# Patient Record
Sex: Female | Born: 1945
Health system: Southern US, Community
[De-identification: ages and names within clinical notes are randomized; demographics above are authoritative.]

## PROBLEM LIST (undated history)

## (undated) DIAGNOSIS — J301 Allergic rhinitis due to pollen: Secondary | ICD-10-CM

## (undated) DIAGNOSIS — K219 Gastro-esophageal reflux disease without esophagitis: Secondary | ICD-10-CM

## (undated) DIAGNOSIS — E785 Hyperlipidemia, unspecified: Secondary | ICD-10-CM

## (undated) DIAGNOSIS — M5431 Sciatica, right side: Secondary | ICD-10-CM

## (undated) DIAGNOSIS — K645 Perianal venous thrombosis: Secondary | ICD-10-CM

## (undated) DIAGNOSIS — K589 Irritable bowel syndrome without diarrhea: Secondary | ICD-10-CM

## (undated) DIAGNOSIS — G4700 Insomnia, unspecified: Secondary | ICD-10-CM

## (undated) DIAGNOSIS — M5136 Other intervertebral disc degeneration, lumbar region: Secondary | ICD-10-CM

## (undated) DIAGNOSIS — R296 Repeated falls: Secondary | ICD-10-CM

## (undated) DIAGNOSIS — E559 Vitamin D deficiency, unspecified: Secondary | ICD-10-CM

## (undated) DIAGNOSIS — M8580 Other specified disorders of bone density and structure, unspecified site: Secondary | ICD-10-CM

## (undated) DIAGNOSIS — J329 Chronic sinusitis, unspecified: Secondary | ICD-10-CM

## (undated) DIAGNOSIS — I253 Aneurysm of heart: Secondary | ICD-10-CM

## (undated) DIAGNOSIS — E2839 Other primary ovarian failure: Secondary | ICD-10-CM

## (undated) DIAGNOSIS — M722 Plantar fascial fibromatosis: Secondary | ICD-10-CM

## (undated) DIAGNOSIS — N8110 Cystocele, unspecified: Secondary | ICD-10-CM

## (undated) DIAGNOSIS — K5900 Constipation, unspecified: Secondary | ICD-10-CM

## (undated) DIAGNOSIS — M51369 Other intervertebral disc degeneration, lumbar region without mention of lumbar back pain or lower extremity pain: Secondary | ICD-10-CM

## (undated) HISTORY — DX: Plantar fascial fibromatosis: M72.2

## (undated) HISTORY — DX: Other specified disorders of bone density and structure, unspecified site: M85.80

## (undated) HISTORY — DX: Perianal venous thrombosis: K64.5

## (undated) HISTORY — DX: Gastro-esophageal reflux disease without esophagitis: K21.9

## (undated) HISTORY — DX: Sciatica, right side: M54.31

## (undated) HISTORY — DX: Irritable bowel syndrome, unspecified: K58.9

## (undated) HISTORY — PX: TUBAL LIGATION: SHX77

## (undated) HISTORY — DX: Hyperlipidemia, unspecified: E78.5

## (undated) HISTORY — DX: Cystocele, unspecified: N81.10

## (undated) HISTORY — DX: Allergic rhinitis due to pollen: J30.1

## (undated) HISTORY — DX: Other intervertebral disc degeneration, lumbar region without mention of lumbar back pain or lower extremity pain: M51.369

## (undated) HISTORY — DX: Aneurysm of heart: I25.3

## (undated) HISTORY — DX: Constipation, unspecified: K59.00

## (undated) HISTORY — DX: Other intervertebral disc degeneration, lumbar region: M51.36

## (undated) HISTORY — DX: Chronic sinusitis, unspecified: J32.9

## (undated) HISTORY — DX: Repeated falls: R29.6

## (undated) HISTORY — DX: Insomnia, unspecified: G47.00

## (undated) HISTORY — DX: Other primary ovarian failure: E28.39

## (undated) HISTORY — DX: Vitamin D deficiency, unspecified: E55.9

---
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
## 1992-08-27 HISTORY — PX: OTHER SURGICAL HISTORY: SHX169

## 1998-04-11 ENCOUNTER — Ambulatory Visit (HOSPITAL_COMMUNITY): Admission: RE | Admit: 1998-04-11 | Discharge: 1998-04-11 | Payer: Self-pay | Admitting: Internal Medicine

## 1999-05-12 ENCOUNTER — Other Ambulatory Visit: Admission: RE | Admit: 1999-05-12 | Discharge: 1999-05-12 | Payer: Self-pay | Admitting: Internal Medicine

## 2000-08-28 ENCOUNTER — Other Ambulatory Visit: Admission: RE | Admit: 2000-08-28 | Discharge: 2000-08-28 | Payer: Self-pay | Admitting: Internal Medicine

## 2001-10-15 ENCOUNTER — Other Ambulatory Visit: Admission: RE | Admit: 2001-10-15 | Discharge: 2001-10-15 | Payer: Self-pay | Admitting: Internal Medicine

## 2002-10-21 ENCOUNTER — Other Ambulatory Visit: Admission: RE | Admit: 2002-10-21 | Discharge: 2002-10-21 | Payer: Self-pay | Admitting: Internal Medicine

## 2002-12-01 ENCOUNTER — Encounter: Payer: Self-pay | Admitting: Internal Medicine

## 2002-12-01 ENCOUNTER — Ambulatory Visit (HOSPITAL_COMMUNITY): Admission: RE | Admit: 2002-12-01 | Discharge: 2002-12-01 | Payer: Self-pay | Admitting: Internal Medicine

## 2005-01-23 ENCOUNTER — Other Ambulatory Visit: Admission: RE | Admit: 2005-01-23 | Discharge: 2005-01-23 | Payer: Self-pay | Admitting: Family Medicine

## 2005-06-14 ENCOUNTER — Ambulatory Visit: Payer: Self-pay | Admitting: Internal Medicine

## 2005-06-19 ENCOUNTER — Ambulatory Visit: Payer: Self-pay | Admitting: Internal Medicine

## 2005-11-21 ENCOUNTER — Ambulatory Visit: Payer: Self-pay | Admitting: Internal Medicine

## 2005-11-27 ENCOUNTER — Ambulatory Visit: Payer: Self-pay | Admitting: Internal Medicine

## 2006-03-13 ENCOUNTER — Other Ambulatory Visit: Admission: RE | Admit: 2006-03-13 | Discharge: 2006-03-13 | Payer: Self-pay | Admitting: Family Medicine

## 2007-03-18 ENCOUNTER — Other Ambulatory Visit: Admission: RE | Admit: 2007-03-18 | Discharge: 2007-03-18 | Payer: Self-pay | Admitting: Family Medicine

## 2008-03-18 ENCOUNTER — Other Ambulatory Visit: Admission: RE | Admit: 2008-03-18 | Discharge: 2008-03-18 | Payer: Self-pay | Admitting: Family Medicine

## 2008-11-23 ENCOUNTER — Encounter: Admission: RE | Admit: 2008-11-23 | Discharge: 2008-11-23 | Payer: Self-pay | Admitting: Family Medicine

## 2009-04-25 ENCOUNTER — Other Ambulatory Visit: Admission: RE | Admit: 2009-04-25 | Discharge: 2009-04-25 | Payer: Self-pay | Admitting: Family Medicine

## 2009-08-27 HISTORY — PX: OTHER SURGICAL HISTORY: SHX169

## 2011-05-07 ENCOUNTER — Other Ambulatory Visit (HOSPITAL_COMMUNITY)
Admission: RE | Admit: 2011-05-07 | Discharge: 2011-05-07 | Disposition: A | Discharge status: Home / Self Care | Payer: BC Managed Care – PPO | Source: Ambulatory Visit | Attending: Family Medicine | Admitting: Family Medicine

## 2011-05-07 ENCOUNTER — Other Ambulatory Visit: Payer: Self-pay | Admitting: Family Medicine

## 2011-05-07 DIAGNOSIS — Z0000 Encounter for general adult medical examination without abnormal findings: Secondary | ICD-10-CM | POA: Insufficient documentation

## 2013-05-22 ENCOUNTER — Telehealth: Payer: Self-pay | Admitting: Internal Medicine

## 2013-05-22 NOTE — Telephone Encounter (Signed)
Pt scheduled to see Willette Cluster NP 05/28/13@10am . Referring office to notify pt of appt and fax records.

## 2013-05-28 ENCOUNTER — Ambulatory Visit: Payer: BC Managed Care – PPO | Admitting: Nurse Practitioner

## 2013-06-15 ENCOUNTER — Ambulatory Visit (INDEPENDENT_AMBULATORY_CARE_PROVIDER_SITE_OTHER)
Admission: RE | Admit: 2013-06-15 | Discharge: 2013-06-15 | Disposition: A | Discharge status: Home / Self Care | Payer: Medicare Other | Source: Ambulatory Visit | Attending: Gastroenterology | Admitting: Gastroenterology

## 2013-06-15 ENCOUNTER — Ambulatory Visit (INDEPENDENT_AMBULATORY_CARE_PROVIDER_SITE_OTHER): Payer: Medicare Other | Admitting: Gastroenterology

## 2013-06-15 ENCOUNTER — Encounter: Payer: Self-pay | Admitting: Gastroenterology

## 2013-06-15 VITALS — BP 116/80 | HR 68 | Ht 67.0 in | Wt 158.2 lb

## 2013-06-15 DIAGNOSIS — R221 Localized swelling, mass and lump, neck: Secondary | ICD-10-CM

## 2013-06-15 DIAGNOSIS — R1310 Dysphagia, unspecified: Secondary | ICD-10-CM

## 2013-06-15 DIAGNOSIS — R220 Localized swelling, mass and lump, head: Secondary | ICD-10-CM

## 2013-06-15 NOTE — Progress Notes (Signed)
I agree with going for esophagram first.

## 2013-06-15 NOTE — Progress Notes (Signed)
06/15/2013 Tracie Young [id number] 1945-11-20   HISTORY OF PRESENT ILLNESS:  This is a pleasant 67 year old female who is a patient of Dr. Regino Schultze.  She presents to our office today with complaints of intermittent dysphagia, mostly to pills.  She does not notice that food gets stuck, but her pills will get hung up at times.  Some days it is worse than others.  Points to the sternal notch when she describes her symptoms.  She does have reflux and takes Nexium daily for quite some time.  She had an EGD in 12/2002 at which time the study was normal.  She feels that her reflux is well controlled with the Nexium.  She does not choke on food or pills and they do not come back up.  No weight loss or change in appetite.     Past Medical History  Diagnosis Date  . Hyperlipidemia   . GERD (gastroesophageal reflux disease)    Past Surgical History  Procedure Laterality Date  . Tonsillectomy and adenoidectomy  1958  . Tubal ligation      reports that she has never smoked. She has never used smokeless tobacco. She reports that she does not drink alcohol or use illicit drugs. family history includes Dementia in her mother; Heart disease in her father and sister; Lung cancer in her sister. Allergies  Allergen Reactions  . Codeine       Outpatient Encounter Prescriptions as of 06/15/2013  Medication Sig Dispense Refill  . atorvastatin (LIPITOR) 20 MG tablet       . cetirizine (ZYRTEC) 10 MG tablet Take 10 mg by mouth daily.      Marland Kitchen guaiFENesin (MUCINEX) 600 MG 12 hr tablet Take 1,200 mg by mouth as needed for congestion.      Marland Kitchen NEXIUM 40 MG capsule        No facility-administered encounter medications on file as of 06/15/2013.     REVIEW OF SYSTEMS  : All other systems reviewed and negative except where noted in the History of Present Illness.   PHYSICAL EXAM: BP 116/80  Pulse 68  Ht 5\' 7"  (1.702 m)  Wt 158 lb 3.2 oz (71.759 kg)  BMI 24.77 kg/m2 General: Well developed white female in  no acute distress Head: Normocephalic and atraumatic Eyes:  sclerae anicteric,conjunctive pink. Ears: Normal auditory acuity Neck: Supple.  She does have a palpable non-tender lump on the left side of the neck. Lungs: Clear throughout to auscultation Heart: Regular rate and rhythm Abdomen: Soft, non-tender, non-distended. No masses or hepatomegaly noted. Normal bowel sounds. Musculoskeletal: Symmetrical with no gross deformities  Skin: No lesions on visible extremities Extremities: No edema  Neurological: Alert oriented x 4, grossly nonfocal Psychological:  Alert and cooperative. Normal mood and affect  ASSESSMENT AND PLAN: -Dysphagia:  Mostly to pills only; intermittent.  ? Stricture or narrowing vs spasm/dysmotility vs Zenker's diverticulum.  I did speak to her about EGD vs barium esophagram with tablet.  She would like to proceed with esophagram for now.   -Left side neck mass/lump:  Will check 2 view neck/soft tissue X-rays.  May need CT scan for evaluation.  ? If this is related to her dysphagia issue.

## 2013-06-15 NOTE — Patient Instructions (Addendum)
Please go to our basement level Radiology department. We scheduled the Barium Tablet test for Wednesday 10-22 at [facility], 1st floor. Go to registration inside the front door of the hospital. Arrive at 9:15 am.   We will call you with the results.

## 2013-06-17 ENCOUNTER — Ambulatory Visit (HOSPITAL_COMMUNITY)
Admission: RE | Admit: 2013-06-17 | Discharge: 2013-06-17 | Disposition: A | Discharge status: Home / Self Care | Payer: Medicare Other | Source: Ambulatory Visit | Attending: Gastroenterology | Admitting: Gastroenterology

## 2013-06-17 DIAGNOSIS — R1310 Dysphagia, unspecified: Secondary | ICD-10-CM | POA: Insufficient documentation

## 2013-06-29 ENCOUNTER — Encounter: Payer: Self-pay | Admitting: Internal Medicine

## 2013-07-16 ENCOUNTER — Encounter: Payer: Self-pay | Admitting: *Deleted

## 2013-07-16 ENCOUNTER — Encounter: Payer: Self-pay | Admitting: Interventional Cardiology

## 2013-07-16 DIAGNOSIS — M722 Plantar fascial fibromatosis: Secondary | ICD-10-CM | POA: Insufficient documentation

## 2013-07-16 DIAGNOSIS — E559 Vitamin D deficiency, unspecified: Secondary | ICD-10-CM | POA: Insufficient documentation

## 2013-07-16 DIAGNOSIS — E785 Hyperlipidemia, unspecified: Secondary | ICD-10-CM | POA: Insufficient documentation

## 2013-07-16 DIAGNOSIS — K645 Perianal venous thrombosis: Secondary | ICD-10-CM | POA: Insufficient documentation

## 2013-07-16 DIAGNOSIS — K219 Gastro-esophageal reflux disease without esophagitis: Secondary | ICD-10-CM | POA: Insufficient documentation

## 2013-07-17 ENCOUNTER — Ambulatory Visit (INDEPENDENT_AMBULATORY_CARE_PROVIDER_SITE_OTHER): Payer: Medicare Other | Admitting: Interventional Cardiology

## 2013-07-17 ENCOUNTER — Encounter (INDEPENDENT_AMBULATORY_CARE_PROVIDER_SITE_OTHER): Payer: Self-pay

## 2013-07-17 ENCOUNTER — Encounter: Payer: Self-pay | Admitting: Interventional Cardiology

## 2013-07-17 VITALS — BP 130/80 | HR 65 | Ht 67.0 in | Wt 156.0 lb

## 2013-07-17 DIAGNOSIS — Z0389 Encounter for observation for other suspected diseases and conditions ruled out: Secondary | ICD-10-CM

## 2013-07-17 DIAGNOSIS — Z8249 Family history of ischemic heart disease and other diseases of the circulatory system: Secondary | ICD-10-CM | POA: Insufficient documentation

## 2013-07-17 DIAGNOSIS — E785 Hyperlipidemia, unspecified: Secondary | ICD-10-CM

## 2013-07-17 DIAGNOSIS — R079 Chest pain, unspecified: Secondary | ICD-10-CM

## 2013-07-17 MED ORDER — ASPIRIN EC 81 MG PO TBEC: 81.0000 mg | DELAYED_RELEASE_TABLET | Freq: Every day | ORAL | Status: AC

## 2013-07-17 NOTE — Patient Instructions (Signed)
Your physician recommends that you schedule a follow-up appointment as needed.  Your physician has recommended you make the following change in your medication:   1. Start Aspirin 81 mg 1 tablet daily.

## 2013-07-17 NOTE — Progress Notes (Signed)
Patient ID: Tracie Young, female   DOB: 05/29/1946, 67 y.o.   MRN: [id number]    [address] [place], [place]  [address] Phone: [phone] Fax:  [phone]  Date:  07/17/2013   ID:  Tracie Young, DOB 08/19/1946, MRN [id number]  PCP:  Cala Bradford, MD      History of Present Illness: Tracie Young is a 67 y.o. female who has a strong family h/o CAD. She had occasional chest discomfort in 2013. It felt like a pressure in her chest and can radiate in the arms. It can radiate to the neck. She can't identify any triggers. Some episodes come at rest. She has high cholesterol. She walks regularly and does yardwork, and has not had sx with these activities. Sx usually come on while watching TV or reading. Typically happens more in the afternoon or evening. Woken from sleep a few times. Total of 6-7 episodes. Longest episode was 60 minutes, but was not severe. Most severe episodes were short, 5 minutes.  She had a stress test in 11/13 showing no ischemia.  She did have some exertional sx with walking.    Sx are now better.  She remains active without any exertional sx.  She had some pressre after stopping nexium.  THis is better than last year.     Wt Readings from Last 3 Encounters:  07/17/13 156 lb (70.761 kg)  06/15/13 158 lb 3.2 oz (71.759 kg)     Past Medical History  Diagnosis Date  . Hyperlipidemia   . GERD (gastroesophageal reflux disease)   . External thrombosed hemorrhoids   . Vitamin D deficiency   . Dyslipidemia   . Plantar fasciitis     Current Outpatient Prescriptions  Medication Sig Dispense Refill  . atorvastatin (LIPITOR) 20 MG tablet       . cetirizine (ZYRTEC) 10 MG tablet Take 10 mg by mouth daily.      Marland Kitchen guaiFENesin (MUCINEX) 600 MG 12 hr tablet Take 1,200 mg by mouth as needed for congestion.      Marland Kitchen NEXIUM 40 MG capsule        No current facility-administered medications for this visit.    Allergies:    Allergies  Allergen Reactions    . Codeine     Social History:  The patient  reports that she has never smoked. She has never used smokeless tobacco. She reports that she does not drink alcohol or use illicit drugs.   Family History:  The patient's family history includes Dementia in her mother; Heart disease in her father and sister; Lung cancer in her sister.   ROS:  Please see the history of present illness.  No nausea, vomiting.  No fevers, chills.  No focal weakness.  No dysuria.   All other systems reviewed and negative.   PHYSICAL EXAM: VS:  BP 130/80  Pulse 65  Ht 5\' 7"  (1.702 m)  Wt 156 lb (70.761 kg)  BMI 24.43 kg/m2 Well nourished, well developed, in no acute distress HEENT: normal Neck: no JVD, no carotid bruits Cardiac:  normal S1, S2; RRR;  Lungs:  clear to auscultation bilaterally, no wheezing, rhonchi or rales Abd: soft, nontender, no hepatomegaly Ext: no edema Skin: warm and dry Neuro:   no focal abnormalities noted  EKG:  normal    ASSESSMENT AND PLAN:  1.Chest discomfort   Some atypical features.  ETT was ok in 2013.  Discussed additional testing but she would like to  hold off.   2. Family history of early CAD  Sister with PTCA, she was a smoker. OK to take aspirin 81 mg daily.  We discussed that aspirin use is currently somewhat controversial.  She has not had bleeding problems.  She does have an atrial septal aneurysm.  She will take aspirin and let us know if she has some bleeding problems.  3. Hypercholesteremia  Continue Lipitor Tablet, 20 MG, 1 tablet, by mouth, Once a day Well controlled in 9/13. LDL 98 in 9/14, HDL 57.     Signed, Fredric Mare, MD, [facility] 07/17/2013 10:25 AM

## 2014-05-20 ENCOUNTER — Telehealth: Payer: Self-pay | Admitting: Neurology

## 2014-05-20 ENCOUNTER — Ambulatory Visit: Payer: Medicare Other | Admitting: Neurology

## 2014-05-20 NOTE — Telephone Encounter (Signed)
Pt cancelled NP appt w/ Dr. Tomi Likens. Referring office notified via EPIC referral / Sherri S.

## 2014-06-21 ENCOUNTER — Other Ambulatory Visit: Payer: Self-pay | Admitting: Family Medicine

## 2014-06-21 ENCOUNTER — Other Ambulatory Visit (HOSPITAL_COMMUNITY)
Admission: RE | Admit: 2014-06-21 | Discharge: 2014-06-21 | Disposition: A | Discharge status: Home / Self Care | Payer: Medicare PPO | Source: Ambulatory Visit | Attending: Family Medicine | Admitting: Family Medicine

## 2014-06-21 DIAGNOSIS — Z124 Encounter for screening for malignant neoplasm of cervix: Secondary | ICD-10-CM | POA: Insufficient documentation

## 2014-06-22 LAB — CYTOLOGY - PAP

## 2015-09-05 ENCOUNTER — Ambulatory Visit: Payer: Medicare Other | Admitting: Gastroenterology

## 2015-10-18 DIAGNOSIS — E785 Hyperlipidemia, unspecified: Secondary | ICD-10-CM | POA: Diagnosis not present

## 2015-11-01 ENCOUNTER — Ambulatory Visit (INDEPENDENT_AMBULATORY_CARE_PROVIDER_SITE_OTHER): Payer: PPO | Admitting: Gastroenterology

## 2015-11-01 ENCOUNTER — Encounter: Payer: Self-pay | Admitting: Gastroenterology

## 2015-11-01 ENCOUNTER — Ambulatory Visit: Payer: Medicare Other | Admitting: Gastroenterology

## 2015-11-01 VITALS — BP 102/66 | HR 84 | Ht 67.0 in | Wt 161.4 lb

## 2015-11-01 DIAGNOSIS — K5902 Outlet dysfunction constipation: Secondary | ICD-10-CM

## 2015-11-01 DIAGNOSIS — K219 Gastro-esophageal reflux disease without esophagitis: Secondary | ICD-10-CM

## 2015-11-01 MED ORDER — NA SULFATE-K SULFATE-MG SULF 17.5-3.13-1.6 GM/177ML PO SOLN: 1.0000 | Freq: Once | ORAL | Status: DC

## 2015-11-01 NOTE — Addendum Note (Signed)
Addended by: Oda Kilts on: 11/01/2015 03:56 PM   Modules accepted: Orders

## 2015-11-01 NOTE — Progress Notes (Signed)
ASTELLA COFFELT    [id number]    20-Sep-1945  Primary Care Physician:WHITE,CYNTHIA S, MD  Referring Physician: Harlan Stains, MD [place], [place] [address]  Chief complaint: Constipation  HPI: 70 year old female with history of chronic GERD and constipation previously followed by Dr. Olevia Perches is here to establish care. She has difficulty evacuating, has tried multiple laxatives with no improvement, feels she gets bloated with abdominal discomfort when she takes laxatives that fails to have a bowel movement or has to take large amount until she has diarrhea or liquid stool to evacuate She has been using rectal enema once every 3 days for past 10 years to have a bowel movement or evacuate. She denies any blood per rectum. Last colonoscopy in 2007 was normal. Denies any pelvic surgeries. She is on Nexium daily with stable GERD symptoms, denies dysphagia, odynophagia, nausea or vomiting.   Outpatient Encounter Prescriptions as of 11/01/2015  Medication Sig  . aspirin EC 81 MG tablet Take 1 tablet (81 mg total) by mouth daily.  Marland Kitchen atorvastatin (LIPITOR) 20 MG tablet Take 10 mg by mouth daily at 6 PM.   . cetirizine (ZYRTEC) 10 MG tablet Take 10 mg by mouth daily.  Marland Kitchen guaiFENesin (MUCINEX) 600 MG 12 hr tablet Take 1,200 mg by mouth as needed for congestion.  Marland Kitchen NEXIUM 40 MG capsule Take 40 mg by mouth daily at 12 noon.    No facility-administered encounter medications on file as of 11/01/2015.    Allergies as of 11/01/2015 - Review Complete 11/01/2015  Allergen Reaction Noted  . Amitiza [lubiprostone] Nausea Only 02/16/2014  . Codeine  06/15/2013  . Flonase [fluticasone propionate]  02/16/2014  . Miralax [polyethylene glycol]  02/16/2014  . Pravachol [pravastatin sodium]  02/16/2014  . Zocor [simvastatin]  02/16/2014    Past Medical History  Diagnosis Date  . Hyperlipidemia   . GERD (gastroesophageal reflux disease)   . External thrombosed hemorrhoids     . Vitamin D deficiency   . Dyslipidemia   . Plantar fasciitis     Past Surgical History  Procedure Laterality Date  . Tonsillectomy and adenoidectomy  1958  . Tubal ligation      Family History  Problem Relation Age of Onset  . Lung cancer Sister   . Heart disease Father   . Heart disease Sister   . Dementia Mother     Social History   Social History  . Marital Status: Married    Spouse Name: N/A  . Number of Children: 3  . Years of Education: N/A   Occupational History  . Retired    Social History Main Topics  . Smoking status: Never Smoker   . Smokeless tobacco: Never Used  . Alcohol Use: No  . Drug Use: No  . Sexual Activity: Not on file   Other Topics Concern  . Not on file   Social History Narrative      Review of systems: Review of Systems  Constitutional: Negative for fever and chills.  HENT: Negative.   Eyes: Negative for blurred vision.  Respiratory: Negative for cough, shortness of breath and wheezing.   Cardiovascular: Negative for chest pain and palpitations.  Gastrointestinal: as per HPI Genitourinary: Negative for dysuria, urgency, frequency and hematuria.  Musculoskeletal: Negative for myalgias, back pain and joint pain.  Skin: Negative for itching and rash.  Neurological: Negative for dizziness, tremors, focal weakness, seizures and loss of consciousness.  Endo/Heme/Allergies: Negative for environmental allergies.  Psychiatric/Behavioral: Negative for depression, suicidal ideas and hallucinations.  All other systems reviewed and are negative.   Physical Exam: Filed Vitals:   11/01/15 1459  BP: 102/66  Pulse: 84   Gen:      No acute distress HEENT:  EOMI, sclera anicteric Neck:     No masses; no thyromegaly Lungs:    Clear to auscultation bilaterally; normal respiratory effort CV:         Regular rate and rhythm; no murmurs Abd:      + bowel sounds; soft, non-tender; no palpable masses, no distension Ext:    No edema; adequate  peripheral perfusion Skin:      Warm and dry; no rash Neuro: alert and oriented x 3 Psych: normal mood and affect  Data Reviewed:  Colonoscopy 2007 Normal   Assessment and Plan/Recommendations:  70 year old female with chronic GERD and constipation is here to establish care Based on history patient likely has outlet dysfunction with dyssynergia defecation We'll schedule for anorectal manometry for evaluation and based on findings we'll refer her to pelvic physical therapy for biofeedback She is due for screening colonoscopy, we'll schedule it GERD: Stable symptoms Continue PPI daily and antireflux measures Return in 3-4 months  K. Denzil Magnuson , MD [phone] Mon-Fri 8a-5p [phone] after 5p, weekends, holidays

## 2015-11-01 NOTE — Patient Instructions (Signed)
You have been scheduled to have an anorectal manometry at [facility] on 11/09/2015 at 12:30pm. Please arrive 30 minutes prior to your appointment time for registration (1st floor of the hospital-admissions).  Please make certain to use 1 Fleets enema 2 hours prior to coming for your appointment. You can purchase Fleets enemas from the laxative section at your drug store. You should not eat anything during the two hours prior to the procedure. You may take regular medications with small sips of water at least 2 hours prior to the study.  Anorectal manometry is a test performed to evaluate patients with constipation or fecal incontinence. This test measures the pressures of the anal sphincter muscles, the sensation in the rectum, and the neural reflexes that are needed for normal bowel movements.  THE PROCEDURE The test takes approximately 30 minutes to 1 hour. You will be asked to change into a hospital gown. A technician or nurse will explain the procedure to you, take a brief health history, and answer any questions you may have. The patient then lies on his or her left side. A small, flexible tube, about the size of a thermometer, with a balloon at the end is inserted into the rectum. The catheter is connected to a machine that measures the pressure. During the test, the small balloon attached to the catheter may be inflated in the rectum to assess the normal reflex pathways. The nurse or technician may also ask the person to squeeze, relax, and push at various times. The anal sphincter muscle pressures are measured during each of these maneuvers. To squeeze, the patient tightens the sphincter muscles as if trying to prevent anything from coming out. To push or bear down, the patient strains down as if trying to have a bowel movement.  You have been scheduled for a colonoscopy. Please follow written instructions given to you at your visit today.  Please pick up your prep supplies at the  pharmacy within the next 1-3 days. If you use inhalers (even only as needed), please bring them with you on the day of your procedure. Your physician has requested that you go to www.startemmi.com and enter the access code given to you at your visit today. This web site gives a general overview about your procedure. However, you should still follow specific instructions given to you by our office regarding your preparation for the procedure.  Follow up in 4 months

## 2015-11-09 ENCOUNTER — Encounter (HOSPITAL_COMMUNITY)
Admission: RE | Disposition: A | Discharge status: Home / Self Care | Payer: Self-pay | Source: Ambulatory Visit | Attending: Gastroenterology

## 2015-11-09 ENCOUNTER — Ambulatory Visit (HOSPITAL_COMMUNITY)
Admission: RE | Admit: 2015-11-09 | Discharge: 2015-11-09 | Disposition: A | Discharge status: Home / Self Care | Payer: PPO | Source: Ambulatory Visit | Attending: Gastroenterology | Admitting: Gastroenterology

## 2015-11-09 DIAGNOSIS — K5902 Outlet dysfunction constipation: Secondary | ICD-10-CM | POA: Diagnosis not present

## 2015-11-09 DIAGNOSIS — K5900 Constipation, unspecified: Secondary | ICD-10-CM | POA: Insufficient documentation

## 2015-11-09 HISTORY — PX: ANAL RECTAL MANOMETRY: SHX6358

## 2015-11-09 SURGERY — MANOMETRY, ANORECTAL

## 2015-11-09 NOTE — Progress Notes (Signed)
Anal manometry done per protocol with balloon expulsion test. Pt tolerated well with no complications. Report to be sent to Dr. Silverio Decamp.

## 2015-11-10 ENCOUNTER — Encounter (HOSPITAL_COMMUNITY): Payer: Self-pay | Admitting: Gastroenterology

## 2015-11-30 DIAGNOSIS — L600 Ingrowing nail: Secondary | ICD-10-CM | POA: Diagnosis not present

## 2015-11-30 DIAGNOSIS — K5902 Outlet dysfunction constipation: Secondary | ICD-10-CM | POA: Insufficient documentation

## 2015-11-30 DIAGNOSIS — B351 Tinea unguium: Secondary | ICD-10-CM | POA: Diagnosis not present

## 2015-12-02 ENCOUNTER — Other Ambulatory Visit: Payer: Self-pay

## 2015-12-02 DIAGNOSIS — K5902 Outlet dysfunction constipation: Secondary | ICD-10-CM

## 2015-12-02 DIAGNOSIS — R198 Other specified symptoms and signs involving the digestive system and abdomen: Secondary | ICD-10-CM

## 2015-12-06 ENCOUNTER — Encounter: Payer: PPO | Admitting: Gastroenterology

## 2015-12-13 ENCOUNTER — Encounter: Payer: Self-pay | Admitting: Gastroenterology

## 2015-12-13 ENCOUNTER — Ambulatory Visit (AMBULATORY_SURGERY_CENTER): Payer: PPO | Admitting: Gastroenterology

## 2015-12-13 VITALS — BP 136/87 | HR 63 | Temp 98.0°F | Resp 20 | Ht 67.0 in | Wt 161.0 lb

## 2015-12-13 DIAGNOSIS — K5900 Constipation, unspecified: Secondary | ICD-10-CM | POA: Diagnosis not present

## 2015-12-13 DIAGNOSIS — Z1211 Encounter for screening for malignant neoplasm of colon: Secondary | ICD-10-CM

## 2015-12-13 DIAGNOSIS — K5902 Outlet dysfunction constipation: Secondary | ICD-10-CM

## 2015-12-13 MED ORDER — SODIUM CHLORIDE 0.9 % IV SOLN: 500.0000 mL | INTRAVENOUS | Status: DC

## 2015-12-13 NOTE — Progress Notes (Addendum)
Called to room to assist during endoscopic procedure.  Patient ID and intended procedure confirmed with present staff. Received instructions for my participation in the procedure from the performing physician.   Dr. Silverio Decamp thought she saw a polyp, but decided it was not.  No path taken. maw

## 2015-12-13 NOTE — Progress Notes (Signed)
Patient awakening,vss,report to rn 

## 2015-12-13 NOTE — Op Note (Signed)
[place] Patient Name: Tracie Young Procedure Date: 12/13/2015 1:38 PM MRN: [id number] Endoscopist: Mauri Pole , MD Age: 70 Date of Birth: 07-Sep-1945 Gender: Female Procedure:                Colonoscopy Indications:              Screening for colorectal malignant neoplasm Medicines:                Monitored Anesthesia Care Procedure:                Pre-Anesthesia Assessment:                           - Prior to the procedure, a History and Physical                            was performed, and patient medications and                            allergies were reviewed. The patient's tolerance of                            previous anesthesia was also reviewed. The risks                            and benefits of the procedure and the sedation                            options and risks were discussed with the patient.                            All questions were answered, and informed consent                            was obtained. Prior Anticoagulants: The patient has                            taken no previous anticoagulant or antiplatelet                            agents. ASA Grade Assessment: II - A patient with                            mild systemic disease. After reviewing the risks                            and benefits, the patient was deemed in                            satisfactory condition to undergo the procedure.                           After obtaining informed consent, the colonoscope  was passed under direct vision. Throughout the                            procedure, the patient's blood pressure, pulse, and                            oxygen saturations were monitored continuously. The                            Model CF-HQ190L [id number]) scope was introduced                            through the anus and advanced to the the cecum,                            identified by appendiceal orifice and ileocecal                          valve. The colonoscopy was performed without                            difficulty. The patient tolerated the procedure                            well. The quality of the bowel preparation was                            good. The ileocecal valve, appendiceal orifice, and                            rectum were photographed. Scope In: 1:42:47 PM Scope Out: 2:13:38 PM Scope Withdrawal Time: 0 hours 19 minutes 57 seconds  Total Procedure Duration: 0 hours 30 minutes 51 seconds  Findings:                 The perianal and digital rectal examinations were                            normal.                           The entire examined colon appeared normal on direct                            and retroflexion views. Complications:            No immediate complications. Estimated Blood Loss:     Estimated blood loss: none. Impression:               - The entire examined colon is normal on direct and                            retroflexion views.                           - No specimens collected. Recommendation:           -  Patient has a contact number available for                            emergencies. The signs and symptoms of potential                            delayed complications were discussed with the                            patient. Return to normal activities tomorrow.                            Written discharge instructions were provided to the                            patient.                           - Resume previous diet.                           - Continue present medications.                           - Repeat colonoscopy in 10 years for screening                            purposes.                           - Return to GI clinic PRN. Mauri Pole, MD 12/13/2015 2:23:21 PM This report has been signed electronically.

## 2015-12-13 NOTE — Patient Instructions (Signed)
YOU HAD AN ENDOSCOPIC PROCEDURE TODAY AT [facility]:   Refer to the procedure report that was given to you for any specific questions about what was found during the examination.  If the procedure report does not answer your questions, please call your gastroenterologist to clarify.  If you requested that your care partner not be given the details of your procedure findings, then the procedure report has been included in a sealed envelope for you to review at your convenience later.  YOU SHOULD EXPECT: Some feelings of bloating in the abdomen. Passage of more gas than usual.  Walking can help get rid of the air that was put into your GI tract during the procedure and reduce the bloating. If you had a lower endoscopy (such as a colonoscopy or flexible sigmoidoscopy) you may notice spotting of blood in your stool or on the toilet paper. If you underwent a bowel prep for your procedure, you may not have a normal bowel movement for a few days.  Please Note:  You might notice some irritation and congestion in your nose or some drainage.  This is from the oxygen used during your procedure.  There is no need for concern and it should clear up in a day or so.  SYMPTOMS TO REPORT IMMEDIATELY:   Following lower endoscopy (colonoscopy or flexible sigmoidoscopy):  Excessive amounts of blood in the stool  Significant tenderness or worsening of abdominal pains  Swelling of the abdomen that is new, acute  Fever of 100F or higher  For urgent or emergent issues, a gastroenterologist can be reached at any hour by calling [phone].   DIET: Your first meal following the procedure should be a small meal and then it is ok to progress to your normal diet. Heavy or fried foods are harder to digest and may make you feel nauseous or bloated.  Likewise, meals heavy in dairy and vegetables can increase bloating.  Drink plenty of fluids but you should avoid alcoholic beverages for 24  hours.  ACTIVITY:  You should plan to take it easy for the rest of today and you should NOT DRIVE or use heavy machinery until tomorrow (because of the sedation medicines used during the test).    FOLLOW UP: Our staff will call the number listed on your records the next business day following your procedure to check on you and address any questions or concerns that you may have regarding the information given to you following your procedure. If we do not reach you, we will leave a message.  However, if you are feeling well and you are not experiencing any problems, there is no need to return our call.  We will assume that you have returned to your regular daily activities without incident.  If any biopsies were taken you will be contacted by phone or by letter within the next 1-3 weeks.  Please call us at [phone] if you have not heard about the biopsies in 3 weeks.    SIGNATURES/CONFIDENTIALITY: You and/or your care partner have signed paperwork which will be entered into your electronic medical record.  These signatures attest to the fact that that the information above on your After Visit Summary has been reviewed and is understood.  Full responsibility of the confidentiality of this discharge information lies with you and/or your care-partner.  Next colonoscopy 10 years.

## 2015-12-14 ENCOUNTER — Telehealth: Payer: Self-pay | Admitting: *Deleted

## 2015-12-14 NOTE — Telephone Encounter (Signed)
  Follow up Call-  Call back number 12/13/2015  Post procedure Call Back phone  # [phone] husbands cell.  Permission to leave phone message Yes     Patient questions:  Do you have a fever, pain , or abdominal swelling? No. Pain Score  0 *  Have you tolerated food without any problems? Yes.    Have you been able to return to your normal activities? Yes.    Do you have any questions about your discharge instructions: Diet   No. Medications  No. Follow up visit  No.  Do you have questions or concerns about your Care? No.  Actions: * If pain score is 4 or above: No action needed, pain <4.

## 2015-12-15 ENCOUNTER — Ambulatory Visit: Payer: PPO | Attending: Gastroenterology | Admitting: Physical Therapy

## 2015-12-15 ENCOUNTER — Encounter: Payer: Self-pay | Admitting: Physical Therapy

## 2015-12-15 DIAGNOSIS — M6281 Muscle weakness (generalized): Secondary | ICD-10-CM | POA: Insufficient documentation

## 2015-12-15 DIAGNOSIS — R279 Unspecified lack of coordination: Secondary | ICD-10-CM | POA: Diagnosis not present

## 2015-12-15 NOTE — Therapy (Signed)
[facility] [address], [place], [place], [address] Phone: [phone]   Fax:  [phone]  Physical Therapy Evaluation  Patient Details  Name: Tracie Young MRN: [id number] Date of Birth: [birth year] Referring Provider: Dr. Harl Bowie  Encounter Date: 12/15/2015      PT End of Session - 12/15/15 1120    Visit Number 1   Number of Visits 10   Date for PT Re-Evaluation 02/09/16   Authorization Type health team; medicare g-code 10th visit   PT Start Time 0930   PT Stop Time 1015   PT Time Calculation (min) 45 min   Activity Tolerance Patient tolerated treatment well   Behavior During Therapy [facility] for tasks assessed/performed      Past Medical History  Diagnosis Date  . Hyperlipidemia   . GERD (gastroesophageal reflux disease)   . External thrombosed hemorrhoids   . Vitamin D deficiency   . Dyslipidemia   . Plantar fasciitis     Past Surgical History  Procedure Laterality Date  . Tonsillectomy and adenoidectomy  1958  . Tubal ligation    . Anal rectal manometry N/A 11/09/2015    Procedure: ANO RECTAL MANOMETRY;  Surgeon: Mauri Pole, MD;  Location: [facility];  Service: Endoscopy;  Laterality: N/A;    There were no vitals filed for this visit.       Subjective Assessment - 12/15/15 0934    Subjective Patient reports constipation has been progressively worse in the past 6 months.  Patient reports she was suggested to have a bladder tack and medication. Patient has to urinate 4-5 times per night. Patient has to drink water due to having a burning sensation  in the bladder area.  Will  leak urine when sneeze with a full baldder. Patient reports pain with feeling of having to void  and does not fullyfeel she emptys her bladder. Does not fully empty her bowels and has pain.  Has  1 bowel movement per week and has to use an enima   Patient Stated Goals reduce constipation and retrain the pelvic  floor muscles   Currently in Pain? Yes   Pain Score 10-Worst pain ever   Pain Location Abdomen   Pain Orientation Right;Left   Pain Descriptors / Indicators Burning;Sore   Pain Type Chronic pain   Pain Onset More than a month ago   Pain Frequency Intermittent   Aggravating Factors  bowel movement, sitting, laying down   Pain Relieving Factors when moving around            [facility] PT Assessment - 12/15/15 0001    Assessment   Medical Diagnosis R19.8 Rectosphincteric dyssynergia   Referring Provider Dr. Harl Bowie   Onset Date/Surgical Date 06/27/16   Prior Therapy None   Precautions   Precautions None   Restrictions   Weight Bearing Restrictions No   Balance Screen   Has the patient fallen in the past 6 months No   Has the patient had a decrease in activity level because of a fear of falling?  No   Is the patient reluctant to leave their home because of a fear of falling?  No   Home Ecologist residence   Prior Function   Level of Independence Independent   Vocation Retired   Leisure walk 4 times per week for 3 miles   [employer]   Overall Cognitive Status Within Functional Limits for tasks assessed   Observation/Other Assessments  Focus on Therapeutic Outcomes (FOTO)  62% limitation CL  goal is 40% limitation    Posture/Postural Control   Posture/Postural Control No significant limitations   ROM / Strength   AROM / PROM / Strength AROM;PROM;Strength   AROM   Overall AROM  Within functional limits for tasks performed   Overall AROM Comments full lumbar ROM;    PROM   Overall PROM  Within functional limits for tasks performed   Strength   Overall Strength Comments abdominal strength 2/5   Left Hip ABduction 3/5   Left Hip ADduction 4/5   Palpation   Palpation comment tightness located in diahpragm and right lower quadrant                 Pelvic Floor Special Questions - 12/15/15 0001    Are you Pregnant or  attempting pregnancy? No   Prior Pregnancies Yes   Number of Pregnancies 3   Number of Vaginal Deliveries 3   Urinary Leakage Yes   Activities that cause leaking Sneezing  with full bladder   Urinary urgency Yes   Urinary frequency 5x during night; every hour during day   Falling out feeling (prolapse) No   Skin Integrity Intact   Prolapse Anterior Wall   Pelvic Floor Internal Exam Patient confirms identification and approves PT to assess pelvic floor muscles integrity   Exam Type Vaginal;Rectal   Palpation during anal contraction the puborectalis muscle will not come forward.  Patient had difficulty with pushing the therapist finger out and relaxing the pelvic floor   Strength weak squeeze, no lift  rectal and vaginal                  PT Education - 12/15/15 1120    Education provided Yes   Education Details toileting technique; pelvic floor contraction with bearing down then contract, diaphgramatic breathing   Person(s) Educated Patient   Methods Explanation;Demonstration;Verbal cues;Handout   Comprehension Returned demonstration;Verbalized understanding          PT Short Term Goals - 12/15/15 1131    PT SHORT TERM GOAL #1   Title independent with initial HEP for flexibility exercises   Time 4   Period Weeks   Status New   PT SHORT TERM GOAL #2   Title understand toilieting technique to relax pelvic lfoor for a complete bowel movement   Time 4   Period Weeks   Status New   PT SHORT TERM GOAL #3   Title understand the urge to void to work on delaying urination   Time 4   Period Weeks   Status New           PT Long Term Goals - 12/15/15 1132    PT LONG TERM GOAL #1   Title indepenent with HEP for pelvic floor strength   Time 8   Period Weeks   Status New   PT LONG TERM GOAL #2   Title ability to have a bowel movement every 3 days due to improved bowel health and pelvic floor relaxation   Time 8   Period Weeks   Status New   PT LONG TERM GOAL  #3   Title ability to wait 2 hours to urinate due to ability to calm the sensation to urinate   Time 8   Period Weeks   Status New   PT LONG TERM GOAL #4   Title bilateral hip strength and pelvic floor is 4/5 so she is able to push a  bowel movement out with greater ease   Time 8   Period Weeks   Status New   PT LONG TERM GOAL #5   Title pain in lower abdominal and rectum decreased >/= 50%   Time 8   Period Weeks   Status New               Plan - 12/15/15 1121    Clinical Impression Statement Patient is a 70 year old female with diagnosis of Rectosphincteric dyssynergia that is worse in past 6 months with sudden onset.  Patient reports pain in right lower abdominal and anal area at 10/10 when having a bowel movement and walking decreases the pain. Patient has 1 bowel movment every week and uses Miralax to assist in bowel movement.  Patient will strain with a bowel movement and does not feel she emptys fully.  Patient reports urge to urinat frequently every hour and 5 times per night.  When she goes for walk in the woods she will stop and urinate in the woods due to the urge.  Left hip strength for left abduction 3/5 and adduction 4/5.  Pelvic floor strength anally and vaginally is 3/5. Patient has a prolapse in the anterior wall of the vagina.  When patient contracts anally the puborectalis does not go foward and she has difficulty with bearing down anally.  FOTO score is 62% limitation.  Patient is of low complexity.  Patient will benefit form physical therapy to improve pelvic floor strength and coordination.    Rehab Potential Excellent   Clinical Impairments Affecting Rehab Potential None   PT Frequency 1x / week   PT Duration 8 weeks   PT Treatment/Interventions Biofeedback;Electrical Stimulation;Moist Heat;Ultrasound;Therapeutic exercise;Therapeutic activities;Neuromuscular re-education;Patient/family education;Manual techniques   PT Next Visit Plan abdominal massage; bowel  health, left hip strength; abdominal strength   PT Home Exercise Plan abdominal massage; bowel health   Recommended Other Services None   Consulted and Agree with Plan of Care Patient      Patient will benefit from skilled therapeutic intervention in order to improve the following deficits and impairments:  Pain, Increased muscle spasms, Decreased strength, Decreased activity tolerance, Decreased endurance, Decreased coordination  Visit Diagnosis: Muscle weakness (generalized) - Plan: PT plan of care cert/re-cert  Unspecified lack of coordination - Plan: PT plan of care cert/re-cert      G-Codes - [id number]    Functional Assessment Tool Used FOTO score is 62% limitation  goal is 40% limitation   Functional Limitation Other PT primary   Other PT Primary Current Status [id number]) At least 60 percent but less than 80 percent impaired, limited or restricted   Other PT Primary Goal Status [id number]) At least 40 percent but less than 60 percent impaired, limited or restricted       Problem List Patient Active Problem List   Diagnosis Date Noted  . Constipation due to outlet dysfunction   . Family history of ischemic heart disease 07/17/2013  . Hyperlipidemia   . GERD (gastroesophageal reflux disease)   . External thrombosed hemorrhoids   . Vitamin D deficiency   . Dyslipidemia   . Plantar fasciitis   . Dysphagia, unspecified(787.20) 06/15/2013  . Lump in neck 06/15/2013    Earlie Counts, PT 12/15/2015 11:39 AM   [facility] [address] [place], [place], [address] Phone: [phone]   Fax:  [phone]  Name: Tracie Young MRN: [id number] Date of Birth: February 17, 1946

## 2015-12-15 NOTE — Patient Instructions (Signed)
Toileting Techniques for Bowel Movements (Defecation) Using your belly (abdomen) and pelvic floor muscles to have a bowel movement is usually instinctive.  Sometimes people can have problems with these muscles and have to relearn proper defecation (emptying) techniques.  If you have weakness in your muscles, organs that are falling out, decreased sensation in your pelvis, or ignore your urge to go, you may find yourself straining to have a bowel movement.  You are straining if you are: . holding your breath or taking in a huge gulp of air and holding it  . keeping your lips and jaw tensed and closed tightly . turning red in the face because of excessive pushing or forcing . developing or worsening your  hemorrhoids . getting faint while pushing . not emptying completely and have to defecate many times a day  If you are straining, you are actually making it harder for yourself to have a bowel movement.  Many people find they are pulling up with the pelvic floor muscles and closing off instead of opening the anus. Due to lack pelvic floor relaxation and coordination the abdominal muscles, one has to work harder to push the feces out.  Many people have never been taught how to defecate efficiently and effectively.  Notice what happens to your body when you are having a bowel movement.  While you are sitting on the toilet pay attention to the following areas: . Jaw and mouth position . Angle of your hips   . Whether your feet touch the ground or not . Arm placement  . Spine position . Waist . Belly tension . Anus (opening of the anal canal)  An Evacuation/Defecation Plan   Here are the 4 basic points:  1. Lean forward enough for your elbows to rest on your knees 2. Support your feet on the floor or use a low stool if your feet don't touch the floor  3. Push out your belly as if you have swallowed a beach ball-you should feel a widening of your waist 4. Open and relax your pelvic floor muscles,  rather than tightening around the anus      The following conditions my require modifications to your toileting posture:  . If you have had surgery in the past that limits your back, hip, pelvic, knee or ankle flexibility . Constipation   Your healthcare practitioner may make the following additional suggestions and adjustments:  1) Sit on the toilet  a) Make sure your feet are supported. b) Notice your hip angle and spine position-most people find it effective to lean forward or raise their knees, which can help the muscles around the anus to relax  c) When you lean forward, place your forearms on your thighs for support  2) Relax suggestions a) Breath deeply in through your nose and out slowly through your mouth as if you are smelling the flowers and blowing out the candles. b) To become aware of how to relax your muscles, contracting and releasing muscles can be helpful.  Pull your pelvic floor muscles in tightly by using the image of holding back gas, or closing around the anus (visualize making a circle smaller) and lifting the anus up and in.  Then release the muscles and your anus should drop down and feel open. Repeat 5 times ending with the feeling of relaxation. c) Keep your pelvic floor muscles relaxed; let your belly bulge out. d) The digestive tract starts at the mouth and ends at the anal opening, so be   sure to relax both ends of the tube.  Place your tongue on the roof of your mouth with your teeth separated.  This helps relax your mouth and will help to relax the anus at the same time.  3) Empty (defecation) a) Keep your pelvic floor and sphincter relaxed, then bulge your anal muscles.  Make the anal opening wide.  b) Stick your belly out as if you have swallowed a beach ball. c) Make your belly wall hard using your belly muscles while continuing to breathe. Doing this makes it easier to open your anus. d) Breath out and give a grunt (or try using other sounds such as  ahhhh, shhhhh, ohhhh or grrrrrrr).  4) Finish a) As you finish your bowel movement, pull the pelvic floor muscles up and in.  This will leave your anus in the proper place rather than remaining pushed out and down. If you leave your anus pushed out and down, it will start to feel as though that is normal and give you incorrect signals about needing to have a bowel movement.      Sitting    Sit comfortably. Do before having a bowel movement. Allow body's muscles to relax. Place hands on belly. Inhale slowly and deeply for __3_ seconds, so hands move out. Then take __3_ seconds to exhale. Repeat _10__ times. And when you have pain.  Copyright  VHI. All rights reserved.      [facility] [address], [place], [place] [address] Phone # [phone] Fax [phone]  Slow Contraction: Gravity Resisted (Sitting)    Sitting, slowly squeeze pelvic floor for __2_ seconds. Then bear down for 2 sec. Repeat _5__ times. Do __3_ times a day.  Copyright  VHI. All rights reserved.

## 2015-12-21 ENCOUNTER — Encounter: Payer: Self-pay | Admitting: Physical Therapy

## 2015-12-21 ENCOUNTER — Ambulatory Visit: Payer: PPO | Admitting: Physical Therapy

## 2015-12-21 DIAGNOSIS — R279 Unspecified lack of coordination: Secondary | ICD-10-CM

## 2015-12-21 DIAGNOSIS — M6281 Muscle weakness (generalized): Secondary | ICD-10-CM

## 2015-12-21 NOTE — Therapy (Signed)
[facility] [address], [place], [place], [address] Phone: [phone]   Fax:  [phone]  Physical Therapy Treatment  Patient Details  Name: Tracie Young MRN: [id number] Date of Birth: 25-Jun-1946 Referring Provider: Dr. Harl Bowie  Encounter Date: 12/21/2015      PT End of Session - 12/21/15 0938    Visit Number 2   Number of Visits 10   Date for PT Re-Evaluation 02/09/16   Authorization Type health team; medicare g-code 10th visit   PT Start Time 0933   PT Stop Time 1013   PT Time Calculation (min) 40 min   Activity Tolerance Patient tolerated treatment well   Behavior During Therapy [facility] for tasks assessed/performed      Past Medical History  Diagnosis Date  . Hyperlipidemia   . GERD (gastroesophageal reflux disease)   . External thrombosed hemorrhoids   . Vitamin D deficiency   . Dyslipidemia   . Plantar fasciitis     Past Surgical History  Procedure Laterality Date  . Tonsillectomy and adenoidectomy  1958  . Tubal ligation    . Anal rectal manometry N/A 11/09/2015    Procedure: ANO RECTAL MANOMETRY;  Surgeon: Mauri Pole, MD;  Location: [facility];  Service: Endoscopy;  Laterality: N/A;    There were no vitals filed for this visit.      Subjective Assessment - 12/21/15 0935    Subjective I have been doing the exercie and have been reading the direction. I had to take an enema after 5-6 days.  Exercises has reduced in frequency of urination. I go to urinat 3 times per night. Patient can now wait for 1 hour before she will have to urinate.  She has not had urinary leakage.  She is drinking increased water intake.    Patient Stated Goals reduce constipation and retrain the pelvic floor muscles   Currently in Pain? No/denies                         [facility] Adult PT Treatment/Exercise - 12/21/15 0001    Self-Care   Self-Care Other Self-Care Comments   Other  Self-Care Comments  Bowel health   Manual Therapy   Manual Therapy Soft tissue mobilization   Soft tissue mobilization abdominal massage to promote bowel movement                PT Education - 12/21/15 1014    Education provided Yes   Education Details hip strengthening, abodminal massage, bowel health and how the digestive system works   [employer]) Educated Patient   Methods Explanation;Demonstration;Verbal cues;Handout   Comprehension Returned demonstration;Verbalized understanding          PT Short Term Goals - 12/21/15 1015    PT SHORT TERM GOAL #1   Title independent with initial HEP for flexibility exercises   Time 4   Period Weeks   Status Achieved   PT SHORT TERM GOAL #2   Title understand toilieting technique to relax pelvic lfoor for a complete bowel movement   Time 4   Period Weeks   Status Achieved   PT SHORT TERM GOAL #3   Title understand the urge to void to work on delaying urination   Time 4   Period Weeks   Status On-going           PT Long Term Goals - 12/15/15 1132    PT LONG TERM GOAL #1  Title indepenent with HEP for pelvic floor strength   Time 8   Period Weeks   Status New   PT LONG TERM GOAL #2   Title ability to have a bowel movement every 3 days due to improved bowel health and pelvic floor relaxation   Time 8   Period Weeks   Status New   PT LONG TERM GOAL #3   Title ability to wait 2 hours to urinate due to ability to calm the sensation to urinate   Time 8   Period Weeks   Status New   PT LONG TERM GOAL #4   Title bilateral hip strength and pelvic floor is 4/5 so she is able to push a bowel movement out with greater ease   Time 8   Period Weeks   Status New   PT LONG TERM GOAL #5   Title pain in lower abdominal and rectum decreased >/= 50%   Time 8   Period Weeks   Status New               Plan - 12/21/15 1015    Clinical Impression Statement Patient is a 70 year old female with diagnsois of  rectosphincteric dyssynergia that is worse in past 6 months with sudden onset. Patient reports she has to have an enema to have a bowel movement.  Patient has gotten a stool to assist her to have a bowel movement. Patient  has decreased urinary frequency during the day and night.  Patient will benefit from physical therapy to improve toileting.    Rehab Potential Excellent   Clinical Impairments Affecting Rehab Potential None   PT Frequency 1x / week   PT Duration 8 weeks   PT Treatment/Interventions Biofeedback;Electrical Stimulation;Moist Heat;Ultrasound;Therapeutic exercise;Therapeutic activities;Neuromuscular re-education;Patient/family education;Manual techniques   PT Next Visit Plan understand urge to void, pelvid floor EMG for strength and relax   PT Home Exercise Plan progress as needed   Consulted and Agree with Plan of Care Patient      Patient will benefit from skilled therapeutic intervention in order to improve the following deficits and impairments:  Pain, Increased muscle spasms, Decreased strength, Decreased activity tolerance, Decreased endurance, Decreased coordination  Visit Diagnosis: Muscle weakness (generalized)  Unspecified lack of coordination     Problem List Patient Active Problem List   Diagnosis Date Noted  . Constipation due to outlet dysfunction   . Family history of ischemic heart disease 07/17/2013  . Hyperlipidemia   . GERD (gastroesophageal reflux disease)   . External thrombosed hemorrhoids   . Vitamin D deficiency   . Dyslipidemia   . Plantar fasciitis   . Dysphagia, unspecified(787.20) 06/15/2013  . Lump in neck 06/15/2013    Earlie Counts, PT 12/21/2015 10:19 AM   [facility] [address] [place], [place], [address] Phone: [phone]   Fax:  [phone]  Name: Tracie Young MRN: [id number] Date of Birth: [birth year]

## 2015-12-21 NOTE — Patient Instructions (Addendum)
About Abdominal Massage  Abdominal massage, also called external colon massage, is a self-treatment circular massage technique that can reduce and eliminate gas and ease constipation. The colon naturally contracts in waves in a clockwise direction starting from inside the right hip, moving up toward the ribs, across the belly, and down inside the left hip.  When you perform circular abdominal massage, you help stimulate your colon's normal wave pattern of movement called peristalsis.  It is most beneficial when done after eating.  Positioning You can practice abdominal massage with oil while lying down, or in the shower with soap.  Some people find that it is just as effective to do the massage through clothing while sitting or standing.  How to Massage Start by placing your finger tips or knuckles on your right side, just inside your hip bone.  . Make small circular movements while you move upward toward your rib cage.   . Once you reach the bottom right side of your rib cage, take your circular movements across to the left side of the bottom of your rib cage.  . Next, move downward until you reach the inside of your left hip bone.  This is the path your feces travel in your colon. . Continue to perform your abdominal massage in this pattern for 10 minutes each day.     You can apply as much pressure as is comfortable in your massage.  Start gently and build pressure as you continue to practice.  Notice any areas of pain as you massage; areas of slight pain may be relieved as you massage, but if you have areas of significant or intense pain, consult with your healthcare provider.  Other Considerations . General physical activity including bending and stretching can have a beneficial massage-like effect on the colon.  Deep breathing can also stimulate the colon because breathing deeply activates the same nervous system that supplies the colon.   . Abdominal massage should always be used in  combination with a bowel-conscious diet that is high in the proper type of fiber for you, fluids (primarily water), and a regular exercise program.   Introduction to [place] and daily habits can help you predict when your bowels will move on a regular basis.  The consistency and quantity of the stool is usually more important than the frequency.  The goal is to have a regular bowel movement that is soft but formed.   Tips on Emptying Regularly . Eat breakfast.  Usually the best time of day for a bowel movement will be a half hour to an hour after eating.  These times are best because the body uses the gastrocolic reflex, a stimulation of bowel motion that occurs with eating, to help produce a bowel movement.  For some people even a simple hot drink in the morning can help the reflex action begin. . Eat all your meals at a predictable time each day.  The bowel functions best when food is introduced at the same regular intervals. . The amount of food eaten at a given time of day should be about the same size from day to day.  The bowel functions best when food is introduced in similar quantities from day to day. It is fine to have a small breakfast and a large lunch, or vice versa, just be consistent. . Eat two servings of fruit or vegetables and at least one serving of a complex carbohydrates (whole grains such as brown rice, bran, whole wheat bread,  or oatmeal) at each meal. . Drink plenty of water-ideally eight glasses a day.  Be sure to increase your water intake if you are increasing fiber into your diet.  Maintain Healthy Habits . Exercise daily.  You may exercise at any time of day, but you may find that bowel function is helped most if the exercise is at a consistent time each day. . Make sure that you are not rushed and have convenient access to a bathroom at your selected time to empty your bowels.    The Digestive System  How we get nutrition and how we eliminate waste is  usually taken for granted until there is a problem with the system. The digestive system begins at the mouth and ends at the anus. Here is an overview of the parts that make up the digestive system and how each works within this complex system.  Mouth . ingests food/fluid . breaks food down by chewing . begins digestion by adding saliva  Esophagus . this tube carries food/fluid to stomach   Stomach . breaks down and mixes up food . holds food until the bowel is ready for it (up to two hours)  . can hold one liter of food  and fluid (we feel full with 1/2 liter)   Gastrocolic Reflex . initiates peristaltic waves of digested food onward to rectum 3-6 times a day via the pyloric sphincter   Small Intestines (Bowel) . has the duodenum, where most digestion takes place because of added secretions from the pancreas, gallbladder, liver, jejunum, then ilium . takes 4-6 hours for food to go through this section  Colon (Large Intestine) . receives 1000-1500 ml of digested food, which is now a thick liquid, per day  . Has three parts: ascending colon, transverse colon and the descending colon . Transit time through the colon from the ascending colon, transverse colon, and descending colon to the sigmoid (storage) is 24-72 hours depending on what has been eaten; fatty meals take longer . passes along hardened feces to the rectum  Rectum . Once stool enters into the rectum we become aware that we need to have a bowel movement.     How Food Passes Through the Digestive System             Image from http://www.continence-foundation.org.uk  A. Mouth:  Food is chewed in the mouth. This takes up to one minute. B. Esophagus:  Food is swallowed, taking three seconds to pass through the esophagus. C. Stomach:  Digestion starts in the stomach, where food stays for 1-3 hours. D. Small Intestine:  Liquidized food takes about 2-6 hours to travel through the small intestine. By now it is  fully digested. E. Large Intestine:  By the end of its stay in the large intestine (12-48 hours), most of the water content has been absorbed. F. Rectum:  Stools and gas are stored in the rectum until they are expelled through the anus.  Bracing With Bridging (Hook-Lying)    With neutral spine, tighten pelvic floor and abdominals and hold. Lift bottom. Repeat _10__ times. Do __1_ times a day. Do not let hips wobble.    Copyright  VHI. All rights reserved.  Strengthening: Hip Adduction (Side-Lying)    Tighten muscles on front of right thigh, then lift leg _3___ inches from surface, keeping knee locked. Tighten pelvic floor as you bring leg up and down.  Repeat _15___ times per set. Do __1__ sets per session. Do __1__ sessions per day.  http://orth.exer.us/624  Copyright  VHI. All rights reserved.  Strengthening: Hip Abduction (Side-Lying)    Tighten muscles on front of left thigh, then lift leg __6__ inches from surface, keeping knee locked.  Repeat __15__ times per set. Do __1__ sets per session. Do __1__ sessions per day.  http://orth.exer.us/622   Copyright  VHI. All rights reserved.   [place] [address], [place], [place] [address] Phone # [phone] Fax [phone]

## 2015-12-30 DIAGNOSIS — L600 Ingrowing nail: Secondary | ICD-10-CM | POA: Diagnosis not present

## 2015-12-30 DIAGNOSIS — F5101 Primary insomnia: Secondary | ICD-10-CM | POA: Diagnosis not present

## 2016-01-03 ENCOUNTER — Encounter: Payer: Self-pay | Admitting: Physical Therapy

## 2016-01-03 ENCOUNTER — Ambulatory Visit: Payer: PPO | Attending: Gastroenterology | Admitting: Physical Therapy

## 2016-01-03 DIAGNOSIS — M6281 Muscle weakness (generalized): Secondary | ICD-10-CM | POA: Diagnosis not present

## 2016-01-03 DIAGNOSIS — R279 Unspecified lack of coordination: Secondary | ICD-10-CM | POA: Insufficient documentation

## 2016-01-03 NOTE — Therapy (Signed)
[facility] [address] [place], [place], [address] Phone: [phone]   Fax:  [phone]  Physical Therapy Treatment  Patient Details  Name: Tracie Young MRN: [id number] Date of Birth: 1946/08/20 Referring Provider: Dr. Harl Bowie  Encounter Date: 01/03/2016      PT End of Session - 01/03/16 0937    Visit Number 3   Number of Visits 10   Date for PT Re-Evaluation 02/09/16   Authorization Type health team; medicare g-code 10th visit   PT Start Time 0930   PT Stop Time 1010   PT Time Calculation (min) 40 min   Activity Tolerance Patient tolerated treatment well   Behavior During Therapy [facility] for tasks assessed/performed      Past Medical History  Diagnosis Date  . Hyperlipidemia   . GERD (gastroesophageal reflux disease)   . External thrombosed hemorrhoids   . Vitamin D deficiency   . Dyslipidemia   . Plantar fasciitis     Past Surgical History  Procedure Laterality Date  . Tonsillectomy and adenoidectomy  1958  . Tubal ligation    . Anal rectal manometry N/A 11/09/2015    Procedure: ANO RECTAL MANOMETRY;  Surgeon: Mauri Pole, MD;  Location: [facility];  Service: Endoscopy;  Laterality: N/A;    There were no vitals filed for this visit.      Subjective Assessment - 01/03/16 0934    Subjective Frequency of urgency is not improving.  Exercises are helping. Can go longer without enima. I am only taking 1 enima per week instead of three. Lower abdominal and rectal pain decreased by 75%.    Patient Stated Goals reduce constipation and retrain the pelvic floor muscles   Currently in Pain? No/denies                      Pelvic Floor Special Questions - 01/03/16 0001    Pelvic Floor Internal Exam Patient confirms identification and approves PT to assess pelvic floor muscles integrity   Exam Type Vaginal   Strength fair squeeze, definite lift  vaginal           [facility]  Adult PT Treatment/Exercise - 01/03/16 0001    Manual Therapy   Manual Therapy Myofascial release;Internal Pelvic Floor   Myofascial Release to perineal body with slack and release   Internal Pelvic Floor bil. sides of urethra compressor, bil. sides onf ATLF insertion, pubococcygeus, levator ani muscles in hookly position                PT Education - 01/03/16 1012    Education provided No          PT Short Term Goals - 12/21/15 1015    PT SHORT TERM GOAL #1   Title independent with initial HEP for flexibility exercises   Time 4   Period Weeks   Status Achieved   PT SHORT TERM GOAL #2   Title understand toilieting technique to relax pelvic lfoor for a complete bowel movement   Time 4   Period Weeks   Status Achieved   PT SHORT TERM GOAL #3   Title understand the urge to void to work on delaying urination   Time 4   Period Weeks   Status On-going           PT Long Term Goals - 01/03/16 0936    PT LONG TERM GOAL #1   Title indepenent with HEP for pelvic floor strength  Time 8   Period Weeks   Status On-going   PT LONG TERM GOAL #2   Title ability to have a bowel movement every 3 days due to improved bowel health and pelvic floor relaxation   Time 8   Period Weeks   Status On-going   PT LONG TERM GOAL #3   Title ability to wait 2 hours to urinate due to ability to calm the sensation to urinate   Time 8   Period Weeks   Status On-going   PT LONG TERM GOAL #4   Title bilateral hip strength and pelvic floor is 4/5 so she is able to push a bowel movement out with greater ease   Time 8   Period Weeks   Status On-going   PT LONG TERM GOAL #5   Title pain in lower abdominal and rectum decreased >/= 50%   Time 8   Period Weeks   Status Achieved               Plan - 01/03/16 1012    Clinical Impression Statement Patient is a 70 year old female with diagnosis of rectophincteric dyssynergia. Pelvic floor strength has increased from 2/5 to 3/5.   Patient reports her urge has not improved with urination.  Patient is able to walk in the wookd without having to go to the bathroom. Patient is now taking 1 enima insteand of 3. Patient feels like she is not straining as much. Patient can go longer without exterme pain and pain has improved by 80%.  Patient wil lbenefit form physical therapy to reduce pain and improve coordination.    Rehab Potential Excellent   Clinical Impairments Affecting Rehab Potential None   PT Frequency 1x / week   PT Duration 8 weeks   PT Treatment/Interventions Biofeedback;Electrical Stimulation;Moist Heat;Ultrasound;Therapeutic exercise;Therapeutic activities;Neuromuscular re-education;Patient/family education;Manual techniques   PT Next Visit Plan understand urge to void, pelvid floor EMG for strength and relax   PT Home Exercise Plan progress as needed   Consulted and Agree with Plan of Care Patient      Patient will benefit from skilled therapeutic intervention in order to improve the following deficits and impairments:  Pain, Increased muscle spasms, Decreased strength, Decreased activity tolerance, Decreased endurance, Decreased coordination  Visit Diagnosis: Muscle weakness (generalized)  Unspecified lack of coordination     Problem List Patient Active Problem List   Diagnosis Date Noted  . Constipation due to outlet dysfunction   . Family history of ischemic heart disease 07/17/2013  . Hyperlipidemia   . GERD (gastroesophageal reflux disease)   . External thrombosed hemorrhoids   . Vitamin D deficiency   . Dyslipidemia   . Plantar fasciitis   . Dysphagia, unspecified(787.20) 06/15/2013  . Lump in neck 06/15/2013    Earlie Counts, PT 01/03/2016 10:16 AM   [facility] [address] [place], [place], [address] Phone: [phone]   Fax:  [phone]  Name: Tracie Young MRN: [id number] Date of Birth: [birth year]

## 2016-01-10 ENCOUNTER — Ambulatory Visit: Payer: PPO | Admitting: Physical Therapy

## 2016-01-10 ENCOUNTER — Encounter: Payer: Self-pay | Admitting: Physical Therapy

## 2016-01-10 DIAGNOSIS — R279 Unspecified lack of coordination: Secondary | ICD-10-CM

## 2016-01-10 DIAGNOSIS — M6281 Muscle weakness (generalized): Secondary | ICD-10-CM | POA: Diagnosis not present

## 2016-01-10 NOTE — Patient Instructions (Addendum)
Pelvic Rotation: Contract / Relax (Supine)    With knees bent over bolster, right knee crossed over other, press thighs together tightly without allowing movement. Hold __6__ seconds. Relax. Repeat __3__ times per set. Do _1___ sets per session. Do __1__ sessions per day. To find you ASIS area.  Stand in front of mirror.  Slide your thumbs upward till they till they hook under the bone.  If the right one is lower then do the top technique.  http://orth.exer.us/280   Copyright  VHI. All rights reserved.  Lower abdominal/core stability exercises  1. Practice your breathing technique: Inhale through your nose expanding your belly and rib cage. Try not to breathe into your chest. Exhale slowly and gradually out your mouth feeling a sense of softness to your body. Practice multiple times. This can be performed unlimited.  2. Finding the lower abdominals. Laying on your back with the knees bent, place your fingers just below your belly button. Using your breathing technique from above, on your exhale gently pull the belly button away from your fingertips without tensing any other muscles. Practice this 5x. Next, as you exhale, draw belly button inwards and hold onto it...then feel as if you are pulling that muscle across your pelvis like you are tightening a belt. This can be hard to do at first so be patient and practice. Do 5-10 reps 1-3 x day. Always recognize quality over quantity; if your abdominal muscles become tired you will notice you may tighten/contract other muscles. This is the time to take a break.   Practice this first laying on your back, then in sitting, progressing to standing and finally adding it to all your daily movements.   3. Finding your pelvic floor. Using the breathing technique above, when your exhale, this time draw your pelvic floor muscles up as if you were attempting to stop the flow of urination. Be careful NOT to tense any other muscles. This can be hard, BE PATIENT.  Try to hold up to 10 seconds repeating 10x. Try 2x a day. Once you feel you are doing this well, add this contraction to exercise #2. First contracting your pelvic floor followed by lower abdominals.   4. Adding leg movements. Add the following leg movements to challenge your ability to keep your core stable:  1. Single leg drop outs: Laying on your back with knees bent feet flat. Inhale,  dropping one knee outward KEEPING YOUR PELVIS STILL. Exhale as you bring the leg back, simultaneously performing your lower abdominal contraction. Do 5-10 on each leg.   2. Marching: While keeping your pelvis still, lift the right foot a few inches, put it down then lift left foot. This will mimic a march. Start slow to establish control. Once you have control you may speed it up. Do 10-20x. You MUST keep your lower abdominlas contracted while you march. Breathe naturally    3. Single leg slides: Inhale while you slowly slide one leg out keeping your pelvis still. Only slide your leg as far as you can keep your pelvis still. Exhale as you bring the leg back to the start, contracting the lower abdominals as you do that. Keep your upper body relaxed. Do 5-10 on each side.    Relaxation Exercises with the Urge to Void   When you experience an urge to void:  FIRST  Stop and stand very still    Sit down if you can    Don't move    You need to stay  very still to maintain control  SECOND Squeeze your pelvic floor muscles 5 times, like a quick flick, to keep from leaking  THIRD Relax  Take a deep breath and then let it out  Try to make the urge go away by using relaxation and visualization techniques  FINALLY When you feel the urge go away somewhat, walk normally to the bathroom.   If the urge gets suddenly stronger on the way, you may stop again and relax to regain control.  [facility] [address] [place], [place] [address] Phone # [phone] Fax [phone]

## 2016-01-10 NOTE — Therapy (Signed)
[facility] [address], [place], [place], [address] Phone: [phone]   Fax:  [phone]  Physical Therapy Treatment  Patient Details  Name: Tracie Young MRN: [id number] Date of Birth: 1946/07/25 Referring Provider: Dr. Harl Bowie  Encounter Date: 01/10/2016      PT End of Session - 01/10/16 1002    Visit Number 4   Number of Visits 10   Date for PT Re-Evaluation 02/09/16   Authorization Type health team; medicare g-code 10th visit   PT Start Time 0930   PT Stop Time 1010   PT Time Calculation (min) 40 min   Activity Tolerance Patient tolerated treatment well   Behavior During Therapy [facility] for tasks assessed/performed      Past Medical History  Diagnosis Date  . Hyperlipidemia   . GERD (gastroesophageal reflux disease)   . External thrombosed hemorrhoids   . Vitamin D deficiency   . Dyslipidemia   . Plantar fasciitis     Past Surgical History  Procedure Laterality Date  . Tonsillectomy and adenoidectomy  1958  . Tubal ligation    . Anal rectal manometry N/A 11/09/2015    Procedure: ANO RECTAL MANOMETRY;  Surgeon: Mauri Pole, MD;  [facility]: [facility];  Service: Endoscopy;  Laterality: N/A;    There were no vitals filed for this visit.      Subjective Assessment - 01/10/16 0932    Subjective I am doing my exercises.     Patient Stated Goals reduce constipation and retrain the pelvic floor muscles   Currently in Pain? No/denies            [facility] PT Assessment - 01/10/16 0001    Assessment   Medical Diagnosis R19.8 Rectosphincteric dyssynergia   Referring Provider Dr. Harl Bowie   Onset Date/Surgical Date 06/27/16   Prior Therapy None   Precautions   Precautions None   Restrictions   Weight Bearing Restrictions No   Balance Screen   Has the patient fallen in the past 6 months No   Has the patient had a decrease in activity level because of a fear of falling?  No    Is the patient reluctant to leave their home because of a fear of falling?  No   Home Ec[employer] residence   Prior Function   Level of Independence Independent   Vocation Retired   Leisure walk 4 times per week for 3 miles   [employer]   Overall Cognitive Status Within Functional Limits for tasks assessed   Observation/Other Assessments   Focus on Therapeutic Outcomes (FOTO)  50% limitation   Posture/Postural Control   Posture/Postural Control No significant limitations   ROM / Strength   AROM / PROM / Strength Strength   Strength   Overall Strength Comments abdominal strength 2/5   Left Hip ABduction 4-/5   Left Hip ADduction 5/5   Palpation   SI assessment  right ilium is anteriorly rotated                  Pelvic Floor Special Questions - 01/10/16 0001    Exam Type Deferred           [facility] Adult PT Treatment/Exercise - 01/10/16 0001    Manual Therapy   Manual Therapy Muscle Energy Technique   Muscle Energy Technique correct right anteriorly rotated ilium                PT Education -  2016-01-29 1001    Education provided Yes   Education Details abdominmal exercises, self correction of pelvis, and urge to void   Person(s) Educated Patient   Methods Explanation;Demonstration;Verbal cues;Handout   Comprehension Returned demonstration;Verbalized understanding          PT Short Term Goals - 29-Jan-2016 0954    PT SHORT TERM GOAL #3   Title understand the urge to void to work on delaying urination   Time 4   Status Achieved           PT Long Term Goals - January 29, 2016 0933    PT LONG TERM GOAL #1   Title indepenent with HEP for pelvic floor strength   Time 8   Period Weeks   Status Achieved   PT LONG TERM GOAL #2   Title ability to have a bowel movement every 3 days due to improved bowel health and pelvic floor relaxation   Time 8   Period Weeks   Status Achieved   PT LONG TERM GOAL #3   Title ability to wait  2 hours to urinate due to ability to calm the sensation to urinate   Period Weeks   Status Achieved   PT LONG TERM GOAL #4   Title bilateral hip strength and pelvic floor is 4/5 so she is able to push a bowel movement out with greater ease   Time 8   Period Weeks   Status Achieved  pelvic floor is 3/5   PT LONG TERM GOAL #5   Title pain in lower abdominal and rectum decreased >/= 50%   Time 8   Period Weeks   Status Achieved               Plan - January 29, 2016 1002    Clinical Impression Statement Patient is a 70 year old female with diagnosis of rectosphincteric dyssynergia. FOTO improved to 50% limitation.  Pelvic floor strength is 3/5. Left hip adduction 5/5 and abduction 4-/5.  Patient is using one enima per week.  Patient reports straining to have a bowel movement has improved by 75%.  Patient is able to go for her walk without stopping to go to the bathroom.  Patient reports lower abdominal pain has decreased by  80%.  Patient will do diaphragmatic breathing will abolish her pain. Patient is not able to urinate every 2 hours.  Patient has met her goals.  Patient is ready for discharge.    Rehab Potential Excellent   Clinical Impairments Affecting Rehab Potential None   PT Treatment/Interventions Biofeedback;Electrical Stimulation;Moist Heat;Ultrasound;Therapeutic exercise;Therapeutic activities;Neuromuscular re-education;Patient/family education;Manual techniques   PT Next Visit Plan Discharge to HEP   PT Home Exercise Plan Current HEP   Consulted and Agree with Plan of Care Patient      Patient will benefit from skilled therapeutic intervention in order to improve the following deficits and impairments:  Pain, Increased muscle spasms, Decreased strength, Decreased activity tolerance, Decreased endurance, Decreased coordination  Visit Diagnosis: Muscle weakness (generalized)  Unspecified lack of coordination       G-Codes - 29-Jan-2016 1011    Functional Assessment Tool  Used FOTO score is 50% improvement   Functional Limitation Other PT primary   Other PT Primary Goal Status (R4270) At least 40 percent but less than 60 percent impaired, limited or restricted   Other PT Primary Discharge Status (W2376) At least 40 percent but less than 60 percent impaired, limited or restricted      Problem List Patient Active Problem List  Diagnosis Date Noted  . Constipation due to outlet dysfunction   . Family history of ischemic heart disease 07/17/2013  . Hyperlipidemia   . GERD (gastroesophageal reflux disease)   . External thrombosed hemorrhoids   . Vitamin D deficiency   . Dyslipidemia   . Plantar fasciitis   . Dysphagia, unspecified(787.20) 06/15/2013  . Lump in neck 06/15/2013    Earlie Counts, PT 01/10/2016 10:12 AM   [facility] [address], [place], [place], [address] Phone: [phone]   Fax:  [phone]  Name: Tracie Young MRN: [id number] Date of Birth: [birth year] PHYSICAL THERAPY DISCHARGE SUMMARY  Visits from Start of Care: 4  Current functional level related to goals / functional outcomes: See above.   Remaining deficits: See above   Education / Equipment: HEP Plan: Patient agrees to discharge.  Patient goals were met. Patient is being discharged due to meeting the stated rehab goals. Thank you for the referral. Earlie Counts, PT 01/10/2016 10:12 AM   ?????

## 2016-01-11 ENCOUNTER — Ambulatory Visit (INDEPENDENT_AMBULATORY_CARE_PROVIDER_SITE_OTHER): Payer: PPO | Admitting: Podiatry

## 2016-01-11 ENCOUNTER — Encounter: Payer: Self-pay | Admitting: Podiatry

## 2016-01-11 VITALS — BP 89/66 | HR 81 | Resp 16 | Ht 67.0 in | Wt 157.0 lb

## 2016-01-11 DIAGNOSIS — L600 Ingrowing nail: Secondary | ICD-10-CM | POA: Diagnosis not present

## 2016-01-11 DIAGNOSIS — B351 Tinea unguium: Secondary | ICD-10-CM

## 2016-01-11 MED ORDER — TERBINAFINE HCL 250 MG PO TABS: ORAL_TABLET | ORAL | Status: DC

## 2016-01-11 NOTE — Patient Instructions (Signed)

## 2016-01-11 NOTE — Progress Notes (Signed)
   Subjective:    Patient ID: Tracie Young, female    DOB: 1945-08-31, 70 y.o.   MRN: [id number]  HPI Chief Complaint  Patient presents with  . Nail Problem    Right foot; great toe-lateral side; pt stated, "wants nail checked for ingrown toenail"; x3 months; Bilateral; great toes; nail discoloration & thickened nails; pt stated, "wants nails checked for nail fungus"     Review of Systems  Allergic/Immunologic: Positive for environmental allergies.  All other systems reviewed and are negative.      Objective:   Physical Exam        Assessment & Plan:

## 2016-01-11 NOTE — Progress Notes (Signed)
Subjective:     Patient ID: Tracie Young, female   DOB: 17-Jan-1946, 70 y.o.   MRN: [id number]  HPI patient presents with discoloration of the hallux nails bilateral with incurvation of the right hallux lateral border with pain and inability to cut it herself. She is concerned about the discoloration pattern that she had   Review of Systems  All other systems reviewed and are negative.      Objective:   Physical Exam  Constitutional: She is oriented to person, place, and time.  Cardiovascular: Intact distal pulses.   Musculoskeletal: Normal range of motion.  Neurological: She is oriented to person, place, and time.  Skin: Skin is warm.  Nursing note and vitals reviewed.  neurovascular status is found to be intact with muscle strength adequate range of motion within normal limits. Patient's right hallux lateral border is incurvated and sore and there is discoloration of the hallux nail right and left distal two thirds with yellow like discoloration. Patient is found to have good digital perfusion and is well oriented 3     Assessment:     2 separate problems with one being ingrown toenail the right big toe lateral border and the other being a combination of trauma with probable fungal infiltration of the hallux nails bilateral    Plan:     H&P and conditions reviewed with patient. At this point I recommended correction of the ingrown and explained procedure and risk and she wants this done and I infiltrated the right hallux 60 mg Xylocaine Marcaine mixture border exposed matrix and applied phenol 3 applications 30 seconds followed by alcohol lavage and sterile dressing. I then went ahead and discussed treatment for the fungus and she wants to be aggressive and I recommended a pulse Lamisil treatment along with laser and topical. She is scheduled for this and we will do the 2 big toenails far as the laser and she will begin the 7 day pulse treatment

## 2016-01-17 ENCOUNTER — Encounter: Payer: PPO | Admitting: Physical Therapy

## 2016-02-15 ENCOUNTER — Ambulatory Visit: Payer: PPO

## 2016-02-15 DIAGNOSIS — M722 Plantar fascial fibromatosis: Secondary | ICD-10-CM

## 2016-02-15 DIAGNOSIS — B351 Tinea unguium: Secondary | ICD-10-CM

## 2016-02-15 NOTE — Progress Notes (Signed)
Patient ID: Tracie Young, female   DOB: 08/10/1946, 70 y.o.   MRN: [id number] Pt presents with mycotic infection of nails, hallux bilateral  All other systems are negative  Laser therapy administered to affected nails and tolerated well. All safety precautions were in place, reappointed in 1 month for 2nd of 4 treatments

## 2016-03-22 ENCOUNTER — Other Ambulatory Visit: Payer: PPO

## 2016-04-02 DIAGNOSIS — S0083XA Contusion of other part of head, initial encounter: Secondary | ICD-10-CM | POA: Diagnosis not present

## 2016-04-02 DIAGNOSIS — S00531A Contusion of lip, initial encounter: Secondary | ICD-10-CM | POA: Diagnosis not present

## 2016-04-12 ENCOUNTER — Other Ambulatory Visit: Payer: PPO

## 2016-04-12 ENCOUNTER — Telehealth: Payer: Self-pay | Admitting: Podiatry

## 2016-04-12 DIAGNOSIS — H2513 Age-related nuclear cataract, bilateral: Secondary | ICD-10-CM | POA: Diagnosis not present

## 2016-04-12 DIAGNOSIS — W0110XA Fall on same level from slipping, tripping and stumbling with subsequent striking against unspecified object, initial encounter: Secondary | ICD-10-CM | POA: Diagnosis not present

## 2016-04-12 DIAGNOSIS — H538 Other visual disturbances: Secondary | ICD-10-CM | POA: Diagnosis not present

## 2016-04-12 NOTE — Telephone Encounter (Signed)
lvm for pt to call to reschedule laser appt

## 2016-04-13 ENCOUNTER — Ambulatory Visit: Payer: PPO

## 2016-04-13 DIAGNOSIS — B351 Tinea unguium: Secondary | ICD-10-CM

## 2016-04-13 NOTE — Progress Notes (Signed)
Patient ID: Tracie Young, female   DOB: 1946-02-09, 70 y.o.   MRN: [id number] Pt presents with mycotic infection of nails, hallux bilateral, pt also c/o diarrhea after taking lamisil longer than 2 days at a time  All other systems are negative  Laser therapy administered to affected nails and tolerated well. All safety precautions were in place, reappointed in 1 month for 3rd of 4 treatments. Advised her to take 2 doses of lamisil each week until all pulse pack is gone, to avoid GI upset. If GI upset continues she is to D/C medication

## 2016-05-09 ENCOUNTER — Encounter: Payer: Self-pay | Admitting: Podiatry

## 2016-05-09 ENCOUNTER — Ambulatory Visit (INDEPENDENT_AMBULATORY_CARE_PROVIDER_SITE_OTHER): Payer: PPO | Admitting: Podiatry

## 2016-05-09 DIAGNOSIS — B351 Tinea unguium: Secondary | ICD-10-CM

## 2016-05-09 DIAGNOSIS — L03011 Cellulitis of right finger: Secondary | ICD-10-CM

## 2016-05-09 DIAGNOSIS — L03031 Cellulitis of right toe: Secondary | ICD-10-CM | POA: Diagnosis not present

## 2016-05-09 NOTE — Progress Notes (Signed)
Subjective:     Patient ID: Tracie Young, female   DOB: 12-08-1945, 70 y.o.   MRN: [id number]  HPI patient states the big toe on the lateral side has become red and irritated and she's happy so far with the laser treatments   Review of Systems     Objective:   Physical Exam Neurovascular status intact muscle strength adequate with discomfort in the right nail site lateral side with redness and drainage noted    Assessment:     Paronychia infection of the right hallux lateral side    Plan:     Infiltrated the right hallux 60 Milligan times like Marcaine mixture removed tissue on the lateral side created a portal for drainage and explained soaking and applied sterile dressing

## 2016-05-09 NOTE — Patient Instructions (Signed)

## 2016-05-14 ENCOUNTER — Ambulatory Visit: Payer: PPO

## 2016-05-14 DIAGNOSIS — B351 Tinea unguium: Secondary | ICD-10-CM

## 2016-05-15 NOTE — Progress Notes (Signed)
Patient ID: Tracie Young, female   DOB: May 15, 1946, 70 y.o.   MRN: [id number] Pt presents with mycotic infection of nails, hallux bilateral,    All other systems are negative  Laser therapy administered to affected nails and tolerated well. All safety precautions were in place, reappointed in 1 month for 4th and possible final treatment

## 2016-05-28 ENCOUNTER — Telehealth: Payer: Self-pay | Admitting: *Deleted

## 2016-05-28 NOTE — Telephone Encounter (Signed)
Left message for patient at [phone] (Home #) to check to see how they were doing from their Paronychia procedure that was performed on Wednesday, May 09, 2016. Waiting for a response.

## 2016-06-10 DIAGNOSIS — R319 Hematuria, unspecified: Secondary | ICD-10-CM | POA: Diagnosis not present

## 2016-06-10 DIAGNOSIS — N390 Urinary tract infection, site not specified: Secondary | ICD-10-CM | POA: Diagnosis not present

## 2016-06-12 ENCOUNTER — Other Ambulatory Visit: Payer: PPO

## 2016-06-18 ENCOUNTER — Ambulatory Visit (INDEPENDENT_AMBULATORY_CARE_PROVIDER_SITE_OTHER): Payer: PPO

## 2016-06-18 DIAGNOSIS — B351 Tinea unguium: Secondary | ICD-10-CM

## 2016-06-18 NOTE — Progress Notes (Signed)
Patient ID: Tracie Young, female   DOB: 1946/03/21, 70 y.o.   MRN: [id number] Pt presents with mycotic infection of nails, hallux bilateral,    All other systems are negative  Laser therapy administered to affected nails and tolerated well. All safety precautions were in place, reappointed as needed

## 2016-06-25 DIAGNOSIS — E785 Hyperlipidemia, unspecified: Secondary | ICD-10-CM | POA: Diagnosis not present

## 2016-06-25 DIAGNOSIS — Z23 Encounter for immunization: Secondary | ICD-10-CM | POA: Diagnosis not present

## 2016-06-25 DIAGNOSIS — N3000 Acute cystitis without hematuria: Secondary | ICD-10-CM | POA: Diagnosis not present

## 2016-06-25 DIAGNOSIS — Z0000 Encounter for general adult medical examination without abnormal findings: Secondary | ICD-10-CM | POA: Diagnosis not present

## 2016-06-25 DIAGNOSIS — K219 Gastro-esophageal reflux disease without esophagitis: Secondary | ICD-10-CM | POA: Diagnosis not present

## 2016-06-25 DIAGNOSIS — Z79899 Other long term (current) drug therapy: Secondary | ICD-10-CM | POA: Diagnosis not present

## 2016-06-25 DIAGNOSIS — E559 Vitamin D deficiency, unspecified: Secondary | ICD-10-CM | POA: Diagnosis not present

## 2016-06-25 DIAGNOSIS — R296 Repeated falls: Secondary | ICD-10-CM | POA: Diagnosis not present

## 2016-07-09 DIAGNOSIS — H04123 Dry eye syndrome of bilateral lacrimal glands: Secondary | ICD-10-CM | POA: Diagnosis not present

## 2016-07-09 DIAGNOSIS — H524 Presbyopia: Secondary | ICD-10-CM | POA: Diagnosis not present

## 2016-07-09 DIAGNOSIS — H2513 Age-related nuclear cataract, bilateral: Secondary | ICD-10-CM | POA: Diagnosis not present

## 2016-07-09 DIAGNOSIS — H5213 Myopia, bilateral: Secondary | ICD-10-CM | POA: Diagnosis not present

## 2016-07-09 DIAGNOSIS — Z1231 Encounter for screening mammogram for malignant neoplasm of breast: Secondary | ICD-10-CM | POA: Diagnosis not present

## 2016-07-24 ENCOUNTER — Ambulatory Visit: Payer: PPO | Attending: Family Medicine | Admitting: Physical Therapy

## 2016-07-24 DIAGNOSIS — Z9181 History of falling: Secondary | ICD-10-CM | POA: Diagnosis not present

## 2016-07-24 NOTE — Therapy (Signed)
[facility] [address] [place], [place], [address] Phone: [phone]   Fax:  [phone]  Physical Therapy Evaluation  Patient Details  Name: Tracie Young MRN: [id number] Date of Birth: 02-06-1946 Referring Provider: Dr. Harlan Stains  Encounter Date: 07/24/2016      PT End of Session - 07/24/16 1438    Visit Number 1   Number of Visits 1   PT Start Time 1401   PT Stop Time 1434   PT Time Calculation (min) 33 min   Activity Tolerance Patient tolerated treatment well   Behavior During Therapy [facility] for tasks assessed/performed      Past Medical History:  Diagnosis Date  . Dyslipidemia   . External thrombosed hemorrhoids   . GERD (gastroesophageal reflux disease)   . Hyperlipidemia   . Plantar fasciitis   . Vitamin D deficiency     Past Surgical History:  Procedure Laterality Date  . ANAL RECTAL MANOMETRY N/A 11/09/2015   Procedure: ANO RECTAL MANOMETRY;  Surgeon: Mauri Pole, MD;  Location: [facility];  Service: Endoscopy;  Laterality: N/A;  . TONSILLECTOMY AND ADENOIDECTOMY  1958  . TUBAL LIGATION      There were no vitals filed for this visit.       Subjective Assessment - 07/24/16 1402    Subjective Pt is a 70 y/o female who presents to OPPT for hx of falls.  Pt reports she's had a couple of falls when out trail walking with husband.  Pt states leaves cover roots on trail which seem to be main culprit for falls.  Pt feels most falls are to the right.  Pt also states she feels she may not need to be here   Pertinent History HLD, plantar fasciitis   Limitations Walking   Patient Stated Goals "to tell the doctor I'm ok"   Currently in Pain? No/denies            [facility] PT Assessment - 07/24/16 1405      Assessment   Medical Diagnosis frequent falls   Referring Provider Dr. Harlan Stains   Onset Date/Surgical Date --  6 months   Next MD Visit PRN   Prior Therapy 7-8 yrs ago for  broken foot     Precautions   Precautions Fall     Restrictions   Weight Bearing Restrictions No     Balance Screen   Has the patient fallen in the past 6 months Yes   How many times? 4   Has the patient had a decrease in activity level because of a fear of falling?  No   Is the patient reluctant to leave their home because of a fear of falling?  No     Home Environment   Living Environment Private residence   Living Arrangements Spouse/significant other   Type of [place] to enter   Entrance Stairs-Number of Steps 1   [place] One level     Prior Function   Level of Independence Independent   Vocation Volunteer work   Leisure write stories; walks trails with husband 2-4 miles ~ 5x/wk, yardwork     ROM / Strength   AROM / PROM / Strength Strength     Strength   Strength Assessment Site Hip;Knee;Ankle   Right Hip Flexion 4/5   Left Hip Flexion 4/5   Right/Left Knee Right;Left   Right Knee Flexion 5/5   Right Knee Extension 5/5  Left Knee Flexion 5/5   Left Knee Extension 5/5   Right Ankle Dorsiflexion 5/5   Left Ankle Dorsiflexion 5/5     Ambulation/Gait   Ambulation/Gait Yes   Ambulation/Gait Assistance 7: Independent   Ambulation Distance (Feet) 250 Feet   Assistive device None   Gait Pattern Within Functional Limits   Gait velocity 4.03 ft/sec  8.13 sec     Standardized Balance Assessment   Standardized Balance Assessment Berg Balance Test;Timed Up and Go Test;Dynamic Gait Index     Berg Balance Test   Sit to Stand Able to stand without using hands and stabilize independently   Standing Unsupported Able to stand safely 2 minutes   Sitting with Back Unsupported but Feet Supported on Floor or Stool Able to sit safely and securely 2 minutes   Stand to Sit Sits safely with minimal use of hands   Transfers Able to transfer safely, minor use of hands   Standing Unsupported with Eyes Closed Able to stand 10 seconds safely   Standing  Ubsupported with Feet Together Able to place feet together independently and stand 1 minute safely   From Standing, Reach Forward with Outstretched Arm Can reach confidently >25 cm (10")   From Standing Position, Pick up Object from Floor Able to pick up shoe safely and easily   From Standing Position, Turn to Look Behind Over each Shoulder Looks behind from both sides and weight shifts well   Turn 360 Degrees Able to turn 360 degrees safely in 4 seconds or less   Standing Unsupported, Alternately Place Feet on Step/Stool Able to stand independently and safely and complete 8 steps in 20 seconds   Standing Unsupported, One Foot in Front Able to place foot tandem independently and hold 30 seconds   Standing on One Leg Able to lift leg independently and hold > 10 seconds   Total Score 56     Dynamic Gait Index   Level Surface Normal   Change in Gait Speed Normal   Gait with Horizontal Head Turns Normal   Gait with Vertical Head Turns Normal   Gait and Pivot Turn Normal   Step Over Obstacle Normal   Step Around Obstacles Normal   Steps Normal   Total Score 24     Timed Up and Go Test   Normal TUG (seconds) 10.22                   [facility] Adult PT Treatment/Exercise - 07/24/16 1405      Self-Care   Self-Care Other Self-Care Comments   Other Self-Care Comments  educated on fall prevention strategies related to falls including: amb on paved paths when leaves cover trails and roots, making sure rugs are secured or removed if unnecessary, pt verbalized understanding                PT Education - 07/24/16 1437    Education provided Yes   Education Details see self care   Person(s) Educated Patient   Methods Explanation   Comprehension Verbalized understanding                    Plan - 07/24/16 1438    Clinical Impression Statement Pt is a 70 y/o female who presents to OPPT for hx of falling.  Pt reports falls occur when out on walking trails with husband  and usually because she trips on a root that's hidden.  One fall occurred in house when pt tripped on throw  rug.  No indication for skilled PT as pt scored perfect scores on DGI and BERG, and gait velocity and TUG both WNL.  Educated on activity modification to decrease tripping hazards and pt agreeable.  No further PT needs identified.   PT Frequency One time visit   Consulted and Agree with Plan of Care Patient      Patient will benefit from skilled therapeutic intervention in order to improve the following deficits and impairments:     Visit Diagnosis: History of falling - Plan: PT plan of care cert/re-cert      G-Codes - 0000000 [phone]    Functional Assessment Tool Used BERG, DGI, clinical judgement   Functional Limitation Mobility: Walking and moving around   Mobility: Walking and Moving Around Current Status [phone] 0 percent impaired, limited or restricted   Mobility: Walking and Moving Around Goal Status [phone]) 0 percent impaired, limited or restricted   Mobility: Walking and Moving Around Discharge Status [phone]) 0 percent impaired, limited or restricted       Problem List Patient Active Problem List   Diagnosis Date Noted  . Constipation due to outlet dysfunction   . Family history of ischemic heart disease 07/17/2013  . Hyperlipidemia   . GERD (gastroesophageal reflux disease)   . External thrombosed hemorrhoids   . Vitamin D deficiency   . Dyslipidemia   . Plantar fasciitis   . Dysphagia, unspecified(787.20) 06/15/2013  . Lump in neck 06/15/2013      Laureen Abrahams, PT, DPT 07/24/16 2:42 PM    [facility] [address] [place], [place], [address] Phone: [phone]   Fax:  [phone]  Name: MAKYNZI BARDWELL MRN: [id number] Date of Birth: [birth year]

## 2016-10-09 DIAGNOSIS — N8110 Cystocele, unspecified: Secondary | ICD-10-CM | POA: Diagnosis not present

## 2016-10-27 DIAGNOSIS — N390 Urinary tract infection, site not specified: Secondary | ICD-10-CM | POA: Diagnosis not present

## 2016-11-26 DIAGNOSIS — N8110 Cystocele, unspecified: Secondary | ICD-10-CM | POA: Diagnosis not present

## 2016-11-26 DIAGNOSIS — N3946 Mixed incontinence: Secondary | ICD-10-CM | POA: Diagnosis not present

## 2017-01-17 DIAGNOSIS — M7061 Trochanteric bursitis, right hip: Secondary | ICD-10-CM | POA: Diagnosis not present

## 2017-01-17 DIAGNOSIS — M65342 Trigger finger, left ring finger: Secondary | ICD-10-CM | POA: Diagnosis not present

## 2017-01-17 DIAGNOSIS — M5431 Sciatica, right side: Secondary | ICD-10-CM | POA: Diagnosis not present

## 2017-01-29 ENCOUNTER — Other Ambulatory Visit: Payer: Self-pay | Admitting: Family Medicine

## 2017-01-29 DIAGNOSIS — M5431 Sciatica, right side: Secondary | ICD-10-CM

## 2017-02-14 ENCOUNTER — Ambulatory Visit
Admission: RE | Admit: 2017-02-14 | Discharge: 2017-02-14 | Disposition: A | Discharge status: Home / Self Care | Payer: PPO | Source: Ambulatory Visit | Attending: Family Medicine | Admitting: Family Medicine

## 2017-02-14 DIAGNOSIS — S3992XA Unspecified injury of lower back, initial encounter: Secondary | ICD-10-CM | POA: Diagnosis not present

## 2017-02-14 DIAGNOSIS — M5431 Sciatica, right side: Secondary | ICD-10-CM

## 2017-02-14 DIAGNOSIS — M545 Low back pain: Secondary | ICD-10-CM | POA: Diagnosis not present

## 2017-03-21 DIAGNOSIS — M48061 Spinal stenosis, lumbar region without neurogenic claudication: Secondary | ICD-10-CM | POA: Diagnosis not present

## 2017-03-27 DIAGNOSIS — N3020 Other chronic cystitis without hematuria: Secondary | ICD-10-CM | POA: Diagnosis not present

## 2017-03-27 DIAGNOSIS — N8110 Cystocele, unspecified: Secondary | ICD-10-CM | POA: Diagnosis not present

## 2017-03-27 DIAGNOSIS — N390 Urinary tract infection, site not specified: Secondary | ICD-10-CM | POA: Diagnosis not present

## 2017-04-15 ENCOUNTER — Other Ambulatory Visit: Payer: Self-pay | Admitting: Family Medicine

## 2017-04-15 ENCOUNTER — Ambulatory Visit
Admission: RE | Admit: 2017-04-15 | Discharge: 2017-04-15 | Disposition: A | Discharge status: Home / Self Care | Payer: PPO | Source: Ambulatory Visit | Attending: Family Medicine | Admitting: Family Medicine

## 2017-04-15 DIAGNOSIS — M7989 Other specified soft tissue disorders: Secondary | ICD-10-CM | POA: Diagnosis not present

## 2017-04-15 DIAGNOSIS — M7061 Trochanteric bursitis, right hip: Secondary | ICD-10-CM | POA: Diagnosis not present

## 2017-04-15 DIAGNOSIS — N390 Urinary tract infection, site not specified: Secondary | ICD-10-CM | POA: Diagnosis not present

## 2017-04-15 DIAGNOSIS — M79661 Pain in right lower leg: Secondary | ICD-10-CM

## 2017-04-15 DIAGNOSIS — M5431 Sciatica, right side: Secondary | ICD-10-CM | POA: Diagnosis not present

## 2017-04-15 DIAGNOSIS — M79604 Pain in right leg: Secondary | ICD-10-CM | POA: Diagnosis not present

## 2017-06-26 DIAGNOSIS — E559 Vitamin D deficiency, unspecified: Secondary | ICD-10-CM | POA: Diagnosis not present

## 2017-06-26 DIAGNOSIS — E2839 Other primary ovarian failure: Secondary | ICD-10-CM | POA: Diagnosis not present

## 2017-06-26 DIAGNOSIS — E785 Hyperlipidemia, unspecified: Secondary | ICD-10-CM | POA: Diagnosis not present

## 2017-06-26 DIAGNOSIS — Z0000 Encounter for general adult medical examination without abnormal findings: Secondary | ICD-10-CM | POA: Diagnosis not present

## 2017-06-26 DIAGNOSIS — Z23 Encounter for immunization: Secondary | ICD-10-CM | POA: Diagnosis not present

## 2017-06-27 DIAGNOSIS — M8580 Other specified disorders of bone density and structure, unspecified site: Secondary | ICD-10-CM

## 2017-06-27 HISTORY — DX: Other specified disorders of bone density and structure, unspecified site: M85.80

## 2017-07-10 DIAGNOSIS — H524 Presbyopia: Secondary | ICD-10-CM | POA: Diagnosis not present

## 2017-07-10 DIAGNOSIS — Z1231 Encounter for screening mammogram for malignant neoplasm of breast: Secondary | ICD-10-CM | POA: Diagnosis not present

## 2017-07-11 DIAGNOSIS — M85831 Other specified disorders of bone density and structure, right forearm: Secondary | ICD-10-CM | POA: Diagnosis not present

## 2017-07-11 DIAGNOSIS — Z780 Asymptomatic menopausal state: Secondary | ICD-10-CM | POA: Diagnosis not present

## 2017-10-07 DIAGNOSIS — R195 Other fecal abnormalities: Secondary | ICD-10-CM | POA: Diagnosis not present

## 2017-10-07 DIAGNOSIS — R1084 Generalized abdominal pain: Secondary | ICD-10-CM | POA: Diagnosis not present

## 2017-10-11 DIAGNOSIS — J0100 Acute maxillary sinusitis, unspecified: Secondary | ICD-10-CM | POA: Diagnosis not present

## 2017-10-16 DIAGNOSIS — R1084 Generalized abdominal pain: Secondary | ICD-10-CM | POA: Diagnosis not present

## 2017-10-16 DIAGNOSIS — R195 Other fecal abnormalities: Secondary | ICD-10-CM | POA: Diagnosis not present

## 2017-11-11 DIAGNOSIS — J301 Allergic rhinitis due to pollen: Secondary | ICD-10-CM | POA: Diagnosis not present

## 2017-11-11 DIAGNOSIS — B820 Intestinal helminthiasis, unspecified: Secondary | ICD-10-CM | POA: Diagnosis not present

## 2018-01-21 DIAGNOSIS — Z0100 Encounter for examination of eyes and vision without abnormal findings: Secondary | ICD-10-CM | POA: Diagnosis not present

## 2018-04-30 DIAGNOSIS — N760 Acute vaginitis: Secondary | ICD-10-CM | POA: Diagnosis not present

## 2018-06-25 DIAGNOSIS — R69 Illness, unspecified: Secondary | ICD-10-CM | POA: Diagnosis not present

## 2018-06-27 DIAGNOSIS — Z1389 Encounter for screening for other disorder: Secondary | ICD-10-CM | POA: Diagnosis not present

## 2018-06-27 DIAGNOSIS — M542 Cervicalgia: Secondary | ICD-10-CM | POA: Diagnosis not present

## 2018-06-27 DIAGNOSIS — Z23 Encounter for immunization: Secondary | ICD-10-CM | POA: Diagnosis not present

## 2018-06-27 DIAGNOSIS — E559 Vitamin D deficiency, unspecified: Secondary | ICD-10-CM | POA: Diagnosis not present

## 2018-06-27 DIAGNOSIS — E785 Hyperlipidemia, unspecified: Secondary | ICD-10-CM | POA: Diagnosis not present

## 2018-06-27 DIAGNOSIS — L989 Disorder of the skin and subcutaneous tissue, unspecified: Secondary | ICD-10-CM | POA: Diagnosis not present

## 2018-06-27 DIAGNOSIS — K5909 Other constipation: Secondary | ICD-10-CM | POA: Diagnosis not present

## 2018-06-27 DIAGNOSIS — Z0000 Encounter for general adult medical examination without abnormal findings: Secondary | ICD-10-CM | POA: Diagnosis not present

## 2018-06-27 DIAGNOSIS — M545 Low back pain: Secondary | ICD-10-CM | POA: Diagnosis not present

## 2018-06-30 ENCOUNTER — Other Ambulatory Visit: Payer: Self-pay | Admitting: Family Medicine

## 2018-06-30 DIAGNOSIS — M542 Cervicalgia: Secondary | ICD-10-CM

## 2018-07-07 ENCOUNTER — Ambulatory Visit
Admission: RE | Admit: 2018-07-07 | Discharge: 2018-07-07 | Disposition: A | Discharge status: Home / Self Care | Payer: PPO | Source: Ambulatory Visit | Attending: Family Medicine | Admitting: Family Medicine

## 2018-07-07 DIAGNOSIS — M542 Cervicalgia: Secondary | ICD-10-CM

## 2018-07-07 DIAGNOSIS — I6523 Occlusion and stenosis of bilateral carotid arteries: Secondary | ICD-10-CM | POA: Diagnosis not present

## 2018-07-14 DIAGNOSIS — Z1231 Encounter for screening mammogram for malignant neoplasm of breast: Secondary | ICD-10-CM | POA: Diagnosis not present

## 2018-07-14 DIAGNOSIS — H524 Presbyopia: Secondary | ICD-10-CM | POA: Diagnosis not present

## 2018-07-18 DIAGNOSIS — D2272 Melanocytic nevi of left lower limb, including hip: Secondary | ICD-10-CM | POA: Diagnosis not present

## 2018-07-18 DIAGNOSIS — L821 Other seborrheic keratosis: Secondary | ICD-10-CM | POA: Diagnosis not present

## 2018-07-18 DIAGNOSIS — D1801 Hemangioma of skin and subcutaneous tissue: Secondary | ICD-10-CM | POA: Diagnosis not present

## 2018-07-18 DIAGNOSIS — D2372 Other benign neoplasm of skin of left lower limb, including hip: Secondary | ICD-10-CM | POA: Diagnosis not present

## 2018-07-18 DIAGNOSIS — D2239 Melanocytic nevi of other parts of face: Secondary | ICD-10-CM | POA: Diagnosis not present

## 2018-07-18 DIAGNOSIS — D2371 Other benign neoplasm of skin of right lower limb, including hip: Secondary | ICD-10-CM | POA: Diagnosis not present

## 2018-07-18 DIAGNOSIS — D2271 Melanocytic nevi of right lower limb, including hip: Secondary | ICD-10-CM | POA: Diagnosis not present

## 2018-07-18 DIAGNOSIS — L814 Other melanin hyperpigmentation: Secondary | ICD-10-CM | POA: Diagnosis not present

## 2018-07-18 DIAGNOSIS — L570 Actinic keratosis: Secondary | ICD-10-CM | POA: Diagnosis not present

## 2018-07-18 DIAGNOSIS — D225 Melanocytic nevi of trunk: Secondary | ICD-10-CM | POA: Diagnosis not present

## 2018-07-18 DIAGNOSIS — D485 Neoplasm of uncertain behavior of skin: Secondary | ICD-10-CM | POA: Diagnosis not present

## 2018-09-03 DIAGNOSIS — L570 Actinic keratosis: Secondary | ICD-10-CM | POA: Diagnosis not present

## 2018-10-01 DIAGNOSIS — B839 Helminthiasis, unspecified: Secondary | ICD-10-CM | POA: Diagnosis not present

## 2018-10-01 DIAGNOSIS — N3090 Cystitis, unspecified without hematuria: Secondary | ICD-10-CM | POA: Diagnosis not present

## 2018-10-01 DIAGNOSIS — R300 Dysuria: Secondary | ICD-10-CM | POA: Diagnosis not present

## 2018-10-16 ENCOUNTER — Encounter (INDEPENDENT_AMBULATORY_CARE_PROVIDER_SITE_OTHER): Payer: Self-pay

## 2018-10-16 ENCOUNTER — Other Ambulatory Visit (INDEPENDENT_AMBULATORY_CARE_PROVIDER_SITE_OTHER): Payer: Medicare HMO

## 2018-10-16 ENCOUNTER — Ambulatory Visit: Payer: Medicare HMO | Admitting: Physician Assistant

## 2018-10-16 ENCOUNTER — Encounter: Payer: Self-pay | Admitting: Physician Assistant

## 2018-10-16 VITALS — BP 130/74 | HR 89 | Ht 67.0 in | Wt 152.0 lb

## 2018-10-16 DIAGNOSIS — B839 Helminthiasis, unspecified: Secondary | ICD-10-CM

## 2018-10-16 DIAGNOSIS — K589 Irritable bowel syndrome without diarrhea: Secondary | ICD-10-CM

## 2018-10-16 LAB — CBC WITH DIFFERENTIAL/PLATELET
BASOS ABS: 0 10*3/uL (ref 0.0–0.1)
Basophils Relative: 0.9 % (ref 0.0–3.0)
Eosinophils Absolute: 0.2 10*3/uL (ref 0.0–0.7)
Eosinophils Relative: 2.9 % (ref 0.0–5.0)
HEMATOCRIT: 35.9 % — AB (ref 36.0–46.0)
HEMOGLOBIN: 12.1 g/dL (ref 12.0–15.0)
Lymphocytes Relative: 25.7 % (ref 12.0–46.0)
Lymphs Abs: 1.4 10*3/uL (ref 0.7–4.0)
MCHC: 33.6 g/dL (ref 30.0–36.0)
MCV: 90.8 fl (ref 78.0–100.0)
MONOS PCT: 12 % (ref 3.0–12.0)
Monocytes Absolute: 0.6 10*3/uL (ref 0.1–1.0)
Neutro Abs: 3.2 10*3/uL (ref 1.4–7.7)
Neutrophils Relative %: 58.5 % (ref 43.0–77.0)
PLATELETS: 314 10*3/uL (ref 150.0–400.0)
RBC: 3.95 Mil/uL (ref 3.87–5.11)
RDW: 14 % (ref 11.5–15.5)
WBC: 5.4 10*3/uL (ref 4.0–10.5)

## 2018-10-16 NOTE — Patient Instructions (Signed)
Your provider has requested that you go to the basement level for lab work before leaving today. Press "B" on the elevator. The lab is located at the first door on the left as you exit the elevator.  If you do find another worm please collect the specimen and call our office. We would like to test it.

## 2018-10-16 NOTE — Progress Notes (Signed)
Subjective:    Patient ID: Tracie Young, female    DOB: 30-Nov-1945, 73 y.o.   MRN: [id number]  HPI Tracie Young is a pleasant 73 year old white female established with Tracie Young.  She is referred today by her PCP Tracie Young be because of recent probable nematode infection. She has history of GERD, and chronic constipation.  She last had colonoscopy in 2017 which was completely normal. She had undergone previous anal manometry and was found to have dyssynergic defecation due to incomplete anal sphincter relaxation has gone through physical therapy/biofeedback. Says about a year ago she passed a long whitish substance with a bowel movement.  She said this occurred a couple of times.  She says it looked very much like a worm.  She was seen by her PCP and treated empirically with a course of albendazole. Since she did not have any more difficulty until 2 to 3 weeks ago when she passed it she believes was a worm which was about 8 to 10 inches long.  She says it was white, and not moving.  She collected the specimen and took it to Tracie Young office.  She says Tracie Young examined it and said it certainly appeared to be a worm.  It was not sent to the lab for identification.  She has now completed another course of albendazole 2 doses.  Right after she had taken the albendazole following weekend she says she passed several names that looked like worms that were not as long as the initial specimen.  None of them were moving.  She has not had any issues since. She denies any abdominal pain or cramping.  She has mild chronic constipation she manages with water and fiber.  She has not noticed any blood in her stool.  She has city water at home, no foreign travel, no lake or ocean swimming.  She says she does work in her garden quite a bit.  Review of Systems Pertinent positive and negative review of systems were noted in the above HPI section.  All other review of systems was otherwise negative.  Outpatient  Encounter Medications as of 10/16/2018  Medication Sig  . aspirin EC 81 MG tablet Take 1 tablet (81 mg total) by mouth daily.  Marland Kitchen atorvastatin (LIPITOR) 20 MG tablet Take 10 mg by mouth daily at 6 PM.   . cetirizine (ZYRTEC) 10 MG tablet Take 10 mg by mouth as needed.   Marland Kitchen guaiFENesin (MUCINEX) 600 MG 12 hr tablet Take 1,200 mg by mouth as needed for congestion.  Marland Kitchen NEXIUM 40 MG capsule Take 40 mg by mouth daily at 12 noon.   . [DISCONTINUED] ciclopirox (PENLAC) 8 % solution Apply topically at bedtime. Apply over nail and surrounding skin. Apply daily over previous coat. After seven (7) days, may remove with alcohol and continue cycle.  . [DISCONTINUED] Melatonin 3 MG TABS Take 1 tablet by mouth daily.  . [DISCONTINUED] terbinafine (LAMISIL) 250 MG tablet Take one tablet once daily x 7 days, then repeat every month x 4 months (Patient not taking: Reported on 07/24/2016)   No facility-administered encounter medications on file as of 10/16/2018.    Allergies  Allergen Reactions  . Amitiza [Lubiprostone] Nausea Only  . Codeine   . Flonase [Fluticasone Propionate]   . Miralax [Polyethylene Glycol]   . Pravachol [Pravastatin Sodium]     stomach  . Zocor [Simvastatin]     stomach   Patient Active Problem List   Diagnosis Date Noted  .  Constipation due to outlet dysfunction   . Family history of ischemic heart disease 07/17/2013  . Hyperlipidemia   . GERD (gastroesophageal reflux disease)   . External thrombosed hemorrhoids   . Vitamin D deficiency   . Dyslipidemia   . Plantar fasciitis   . Dysphagia, unspecified(787.20) 06/15/2013  . Lump in neck 06/15/2013   Social History   Socioeconomic History  . Marital status: Married    Spouse name: Not on file  . Number of children: 3  . Years of education: Not on file  . Highest education level: Not on file  Occupational History  . Occupation: Retired  Scientific laboratory technician  . Financial resource strain: Not on file  . Food insecurity:    Worry:  Not on file    Inability: Not on file  . Transportation needs:    Medical: Not on file    Non-medical: Not on file  Tobacco Use  . Smoking status: Never Smoker  . Smokeless tobacco: Never Used  Substance and Sexual Activity  . Alcohol use: No  . Drug use: No  . Sexual activity: Not on file  Lifestyle  . Physical activity:    Days per week: Not on file    Minutes per session: Not on file  . Stress: Not on file  Relationships  . Social connections:    Talks on phone: Not on file    Gets together: Not on file    Attends religious service: Not on file    Active member of club or organization: Not on file    Attends meetings of clubs or organizations: Not on file    Relationship status: Not on file  . Intimate partner violence:    Fear of current or ex partner: Not on file    Emotionally abused: Not on file    Physically abused: Not on file    Forced sexual activity: Not on file  Other Topics Concern  . Not on file  Social History Narrative  . Not on file    Tracie Young's family history includes Dementia in her mother; Heart disease in her father and sister; Lung cancer in her sister.      Objective:    Vitals:   10/16/18 1331  BP: 130/74  Pulse: 89    Physical Exam; well-developed older white female in no acute distress, very pleasant, height 5 foot 7, weight 152, BMI 23.8.  HEENT; nontraumatic normocephalic EOMI PERRLA sclera anicteric, oral mucosa moist, Cardiovascular; regular rate and rhythm with S1-S2 no murmur rub or gallop, Pulmonary ;clear bilaterally, Abdomen; soft, nontender nondistended bowel sounds are active there is no palpable mass or hepatosplenomegaly.  Rectal ;exam not done, Extremities; no clubbing cyanosis or edema skin warm and dry, Neuropsych ;alert and oriented, grossly nonfocal mood and affect appropriate       Assessment & Plan:   #29 73 year old white female with recent probable nematode infection. By description consider roundworm  (Ascaris), less likely tapeworm  She has been treated with with a course of albendazole, and has had no times over the past couple of weeks. She is of course anxious to know that infection has been eradicated.  #2 colon cancer screening-up-to-date normal colonoscopy 2017 #3 GERD-stable on Nexium  Plan; We will check CBC with differential to assess for eosinophilia Check stool for O&P Patient was advised to save a specimen if she passes anything else suspicious for worm, and all here so that we can get the specimen sent to the lab  for identification.  Javonta Gronau S Keyondre Hepburn PA-C 10/16/2018   Cc: Harlan Stains, MD

## 2018-10-20 ENCOUNTER — Other Ambulatory Visit: Payer: Medicare HMO

## 2018-10-20 DIAGNOSIS — K589 Irritable bowel syndrome without diarrhea: Secondary | ICD-10-CM | POA: Diagnosis not present

## 2018-10-20 DIAGNOSIS — B839 Helminthiasis, unspecified: Secondary | ICD-10-CM

## 2018-10-20 DIAGNOSIS — R079 Chest pain, unspecified: Secondary | ICD-10-CM | POA: Diagnosis not present

## 2018-10-20 NOTE — Progress Notes (Signed)
Reviewed and agree with documentation and assessment and plan. K. Veena Nandigam , MD   

## 2018-10-21 ENCOUNTER — Telehealth: Payer: Self-pay

## 2018-10-21 NOTE — Telephone Encounter (Signed)
NOTES ON FILE RJ.Marland KitchenMarland KitchenL

## 2018-10-22 ENCOUNTER — Encounter: Payer: Self-pay | Admitting: Interventional Cardiology

## 2018-10-23 LAB — OVA AND PARASITE EXAMINATION
CONCENTRATE RESULT: NONE SEEN
MICRO NUMBER:: 233194
SPECIMEN QUALITY:: ADEQUATE
TRICHROME RESULT:: NONE SEEN

## 2018-10-31 ENCOUNTER — Ambulatory Visit: Payer: Medicare HMO | Admitting: Cardiovascular Disease

## 2018-11-11 ENCOUNTER — Other Ambulatory Visit: Payer: Self-pay

## 2018-11-11 ENCOUNTER — Encounter: Payer: Self-pay | Admitting: Interventional Cardiology

## 2018-11-11 ENCOUNTER — Ambulatory Visit: Payer: Medicare HMO | Admitting: Interventional Cardiology

## 2018-11-11 VITALS — BP 106/72 | HR 82 | Ht 67.0 in | Wt 152.4 lb

## 2018-11-11 DIAGNOSIS — Z8249 Family history of ischemic heart disease and other diseases of the circulatory system: Secondary | ICD-10-CM

## 2018-11-11 DIAGNOSIS — R072 Precordial pain: Secondary | ICD-10-CM | POA: Diagnosis not present

## 2018-11-11 DIAGNOSIS — Z01812 Encounter for preprocedural laboratory examination: Secondary | ICD-10-CM

## 2018-11-11 DIAGNOSIS — R6884 Jaw pain: Secondary | ICD-10-CM | POA: Diagnosis not present

## 2018-11-11 DIAGNOSIS — E782 Mixed hyperlipidemia: Secondary | ICD-10-CM | POA: Diagnosis not present

## 2018-11-11 MED ORDER — METOPROLOL TARTRATE 100 MG PO TABS: ORAL_TABLET | ORAL | 0 refills | Status: DC

## 2018-11-11 NOTE — Patient Instructions (Addendum)
Medication Instructions:  Your physician recommends that you continue on your current medications as directed. Please refer to the Current Medication list given to you today.  If you need a refill on your cardiac medications before your next appointment, please call your pharmacy.   Lab work: Your physician recommends that you return for lab work Artist) prior to Cardiac CT   If you have labs (blood work) drawn today and your tests are completely normal, you will receive your results only by: Marland Kitchen MyChart Message (if you have MyChart) OR . A paper copy in the mail If you have any lab test that is abnormal or we need to change your treatment, we will call you to review the results.  Testing/Procedures: Your physician has requested that you have cardiac CT. Cardiac computed tomography (CT) is a painless test that uses an x-ray machine to take clear, detailed pictures of your heart. For further information please visit [email or web address]. Please follow instruction sheet as given.   Follow-Up: . Based on test results  Any Other Special Instructions Will Be Listed Below (If Applicable).  CARDIAC CT INSTRUCTIONS  Please arrive at the [facility] main entrance of [facility] on ____ at ____ (30-45 minutes prior to test start time)  [facility] [place], [place] [address] [phone]  Proceed to the [facility] Radiology Department (First Floor).  Please follow these instructions carefully (unless otherwise directed):    On the Night Before the Test: . Be sure to Drink plenty of water. . Do not consume any caffeinated/decaffeinated beverages or chocolate 12 hours prior to your test. . Do not take any antihistamines 12 hours prior to your test.  On the Day of the Test: . Drink plenty of water. Do not drink any water within one hour of the test. . Do not eat any food 4 hours prior to the test. . You may take your regular medications prior to the  test.  . Take metoprolol (Lopressor) two hours prior to test.    After the Test: . Drink plenty of water. . After receiving IV contrast, you may experience a mild flushed feeling. This is normal. . On occasion, you may experience a mild rash up to 24 hours after the test. This is not dangerous. If this occurs, you can take Benadryl 25 mg and increase your fluid intake. . If you experience trouble breathing, this can be serious. If it is severe call 911 IMMEDIATELY. If it is mild, please call our office.

## 2018-11-11 NOTE — Progress Notes (Signed)
Cardiology Office Note   Date:  11/11/2018   ID:  Tracie Young, DOB 1946-01-03, MRN [id number]  PCP:  Harlan Stains, MD    No chief complaint on file.  Chest discomfort, family history of coronary artery disease  Wt Readings from Last 3 Encounters:  11/11/18 152 lb 6.4 oz (69.1 kg)  10/16/18 152 lb (68.9 kg)  01/11/16 157 lb (71.2 kg)       History of Present Illness: Tracie Young is a 73 y.o. female  who has a strong family h/o CAD. She had occasional chest discomfort in 2013. She had a stress test in 11/13 showing no ischemia.   Sister with PTCA, she was a smoker. Her son had a stent in 2018  She does have an atrial septal aneurysm and hyperlipidemia.  On Oct 24, 2018, she woke up with some jaw pain.  She thought it was a sinus infection but then it got worse.  She had some associated nausea.  She had some tingling in her arms.  SHe was dizzy and had to lie down on the bathroom floor.  She could not move her arms.   Her husband found her and then they considered calling 911.  They called and then she was told to take some aspirin.  Her hand movements returned.    Husband told her she was cold and clammy.    Her ECG from EMS was ok.  She never went to the hospital.  EMS thought it may have been anxiety.  On occasion, she has a tight feeling in her chest.  It can happen 6 x/ year.  Not related to activity.  Just happens randomly.    She walks regularly for exercise.  No chest pain with that activity.     Past Medical History:  Diagnosis Date  . Atrial septal aneurysm    DR. [place]  . Constipation   . DDD (degenerative disc disease), lumbar    DR. CABBELL, OBSERVATION  . Dyslipidemia   . Estrogen deficiency   . External thrombosed hemorrhoids   . Female cystocele    OVERACTIVE BLADDER, HAS SEEN DR. MACDIARMID  . Frequent falls   . GERD (gastroesophageal reflux disease)   . Hyperlipidemia   . IBS (irritable bowel syndrome)   . Insomnia   . LPRD  (laryngopharyngeal reflux disease)   . Osteopenia 06/2017  . Plantar fasciitis   . Sciatica, right side   . Seasonal allergic rhinitis due to pollen   . Sinusitis    UNSPECIFIED  . Vitamin D deficiency     Past Surgical History:  Procedure Laterality Date  . ANAL RECTAL MANOMETRY N/A 11/09/2015   Procedure: ANO RECTAL MANOMETRY;  Surgeon: Mauri Pole, MD;  Location: [facility];  Service: Endoscopy;  Laterality: N/A;  . CRYOSURGERY AND CONIZATION  1994  . LIPOMA REMOVED FROM BACK  2011   DR. [place]  . TUBAL LIGATION       Current Outpatient Medications  Medication Sig Dispense Refill  . acetaminophen (TYLENOL) 500 MG tablet Take 1,000 mg by mouth at bedtime as needed.    Marland Kitchen aspirin EC 81 MG tablet Take 1 tablet (81 mg total) by mouth daily.    Marland Kitchen atorvastatin (LIPITOR) 20 MG tablet Take 10 mg by mouth daily at 6 PM.     . cholecalciferol (VITAMIN D3) 25 MCG (1000 UT) tablet Take 2,000 Units by mouth daily.    Marland Kitchen  guaiFENesin (MUCINEX) 600 MG 12 hr tablet Take 1,200 mg by mouth as needed for congestion.    Marland Kitchen NEXIUM 40 MG capsule Take 40 mg by mouth daily at 12 noon.     . vitamin C (ASCORBIC ACID) 500 MG tablet Take 500 mg by mouth daily. TAKING DAILY DURING THE WINTER.    Marland Kitchen metoprolol tartrate (LOPRESSOR) 100 MG tablet Take 1 tablet 2 hours prior to Cardiac CT 1 tablet 0   No current facility-administered medications for this visit.     Allergies:   Ciprofloxacin; Amitiza [lubiprostone]; Codeine; Flonase [fluticasone propionate]; Miralax [polyethylene glycol]; Pravachol [pravastatin sodium]; and Zocor [simvastatin]    Social History:  The patient  reports that she has never smoked. She has never used smokeless tobacco. She reports that she does not drink alcohol or use drugs.   Family History:  The patient's family history includes CAD in her father, paternal grandfather, and sister; CVA in her maternal grandmother; Dementia in her  mother; Diabetes in her paternal aunt and paternal aunt; Heart disease in her sister; Heart disease (age of onset: 20) in her father; Hypercholesterolemia in her father, mother, and sister; Hypertension in her father, maternal grandmother, mother, paternal grandfather, and sister; Lung cancer in her sister.    ROS:  Please see the history of present illness.   Otherwise, review of systems are positive for chest discomfort, anxiety.   All other systems are reviewed and negative.    PHYSICAL EXAM: VS:  BP 106/72   Pulse 82   Ht 5\' 7"  (1.702 m)   Wt 152 lb 6.4 oz (69.1 kg)   SpO2 97%   BMI 23.87 kg/m  , BMI Body mass index is 23.87 kg/m. GEN: Well nourished, well developed, in no acute distress  HEENT: normal  Neck: no JVD, carotid bruits, or masses Cardiac: RRR; no murmurs, rubs, or gallops,no edema  Respiratory:  clear to auscultation bilaterally, normal work of breathing GI: soft, nontender, nondistended, + BS MS: no deformity or atrophy  Skin: warm and dry, no rash Neuro:  Strength and sensation are intact Psych: euthymic mood, full affect   EKG:   The ekg ordered today demonstrates NSR, nonspecific ST changes   Recent Labs: 10/16/2018: Hemoglobin 12.1; Platelets 314.0   Lipid Panel No results found for: CHOL, TRIG, HDL, CHOLHDL, VLDL, LDLCALC, LDLDIRECT   Other studies Reviewed: Additional studies/ records that were reviewed today with results demonstrating: EMS ECGs reviewed.  NSR, no ST changes.  No change from today's ECG.   ASSESSMENT AND PLAN:  1. Chest pain/jaw pain : No episodes since Feb 28.  Several atypical features.  She had a negative stress test in 2013.  We will plan for coronary CTA.  This would also tell us if there is even moderate CAD.  May need to adjust her LDL target based on the results of the CT.  It is reassuring that she is not having any exertional symptoms at this time.  Given the current situation with coronavirus, I am not sure when exactly  this test will be done. 2. FH of CAD: Sister and son with CAD. 3. Hyperlipidemia:LDL 131 in Nov 2019 on atorva 10 mg daily.  HDL is high.  If she does have significant plaque on CT, would increase atorvastatin dose.  She has tried coming off of atorvastatin in the past and noted that her cholesterol increases significantly.   Current medicines are reviewed at length with the patient today.  The patient concerns regarding  her medicines were addressed.  The following changes have been made:  No change  Labs/ tests ordered today include:   Orders Placed This Encounter  Procedures  . CT CORONARY MORPH W/CTA COR W/SCORE W/CA W/CM &/OR WO/CM  . CT CORONARY FRACTIONAL FLOW RESERVE DATA PREP  . CT CORONARY FRACTIONAL FLOW RESERVE FLUID ANALYSIS  . Basic metabolic panel  . EKG 12-Lead    Recommend 150 minutes/week of aerobic exercise Low fat, low carb, high fiber diet recommended  Disposition:   FU based in test results   Signed, Larae Grooms, MD  11/11/2018 4:45 PM    [facility] [address], [place], [place]  [address] Phone: [phone]; Fax: [phone]

## 2018-11-27 DIAGNOSIS — L570 Actinic keratosis: Secondary | ICD-10-CM | POA: Diagnosis not present

## 2018-12-22 ENCOUNTER — Other Ambulatory Visit: Payer: Self-pay | Admitting: Interventional Cardiology

## 2018-12-24 NOTE — Telephone Encounter (Signed)
Refill sent in as patient misplaced prior Rx. Patient will need once CT is scheduled.

## 2019-01-01 DIAGNOSIS — J309 Allergic rhinitis, unspecified: Secondary | ICD-10-CM | POA: Diagnosis not present

## 2019-03-02 ENCOUNTER — Other Ambulatory Visit: Payer: Medicare HMO | Admitting: *Deleted

## 2019-03-02 ENCOUNTER — Other Ambulatory Visit: Payer: Self-pay

## 2019-03-02 DIAGNOSIS — R6884 Jaw pain: Secondary | ICD-10-CM

## 2019-03-02 DIAGNOSIS — Z8249 Family history of ischemic heart disease and other diseases of the circulatory system: Secondary | ICD-10-CM

## 2019-03-02 DIAGNOSIS — Z01812 Encounter for preprocedural laboratory examination: Secondary | ICD-10-CM | POA: Diagnosis not present

## 2019-03-02 DIAGNOSIS — R072 Precordial pain: Secondary | ICD-10-CM

## 2019-03-02 LAB — BASIC METABOLIC PANEL
BUN/Creatinine Ratio: 18 (ref 12–28)
BUN: 11 mg/dL (ref 8–27)
CO2: 23 mmol/L (ref 20–29)
Calcium: 9.8 mg/dL (ref 8.7–10.3)
Chloride: 100 mmol/L (ref 96–106)
Creatinine, Ser: 0.6 mg/dL (ref 0.57–1.00)
GFR calc Af Amer: 105 mL/min/{1.73_m2} (ref >59)
GFR calc non Af Amer: 91 mL/min/{1.73_m2} (ref >59)
Glucose: 87 mg/dL (ref 65–99)
Potassium: 4.1 mmol/L (ref 3.5–5.2)
Sodium: 137 mmol/L (ref 134–144)

## 2019-03-06 ENCOUNTER — Ambulatory Visit (HOSPITAL_COMMUNITY)
Admission: RE | Admit: 2019-03-06 | Discharge: 2019-03-06 | Disposition: A | Discharge status: Home / Self Care | Payer: Medicare HMO | Source: Ambulatory Visit | Attending: Interventional Cardiology | Admitting: Interventional Cardiology

## 2019-03-06 ENCOUNTER — Other Ambulatory Visit: Payer: Self-pay

## 2019-03-06 DIAGNOSIS — Z8249 Family history of ischemic heart disease and other diseases of the circulatory system: Secondary | ICD-10-CM | POA: Insufficient documentation

## 2019-03-06 DIAGNOSIS — R072 Precordial pain: Secondary | ICD-10-CM

## 2019-03-06 DIAGNOSIS — R6884 Jaw pain: Secondary | ICD-10-CM

## 2019-03-06 IMAGING — CT CT HEAR MORPH WITH CTA COR WITH SCORE WITH CA WITH CONTRAST AND
4 of 7 series · 8 of 20 positions shown, 9 images · IV contrast (APPLIED)
Comparison: None.
COMPARISON: None.

Addendum:
EXAM:
OVER-READ INTERPRETATION  CT CHEST

The following report is an over-read performed by radiologist Dr.
Mxolod Ert Cecadze [REDACTED] on 03/06/2019. This
over-read does not include interpretation of cardiac or coronary
anatomy or pathology. The coronary calcium score/coronary CTA
interpretation by the cardiologist is attached.
CLINICAL DATA: 72-year-old male with h/o HLP, FH of early CAD and
chest pain.
Cardiac/Coronary  CT
TECHNIQUE: The patient was scanned on a Phillips Force scanner.

[Series 6: best diast 74 % · axial · 0.32mm/px · z∈[+1280,+1325]mm · 2 of 336 slices shown, 3 images]
[im 112/336  vessel]
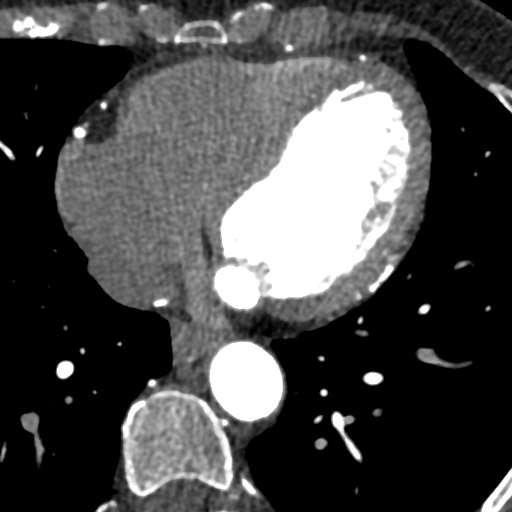
[im 112/336  lung]
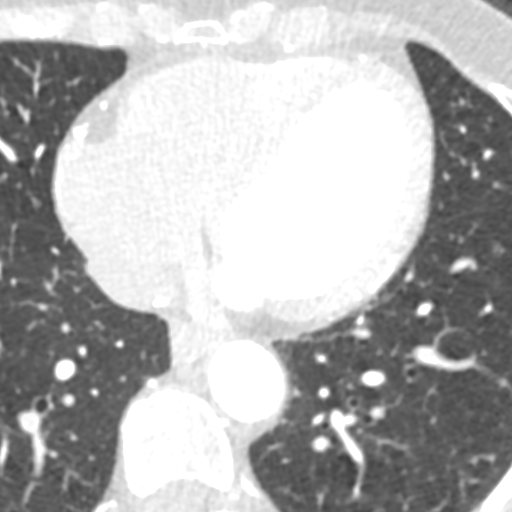
[im 224/336  vessel]
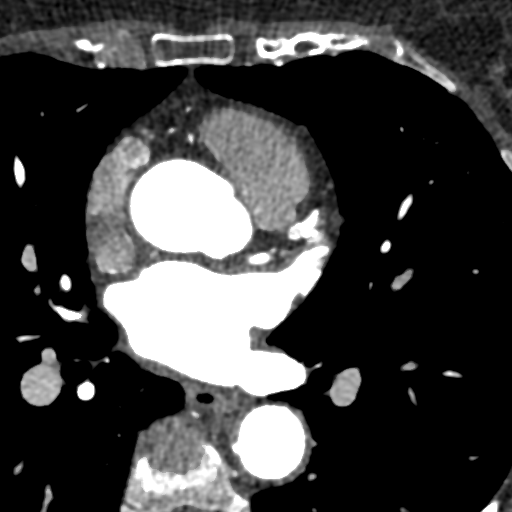

[Series 7: best syst 39 % · axial · 0.32mm/px · z∈[+1280,+1325]mm · 2 of 336 slices shown]
[im 112/336  vessel]
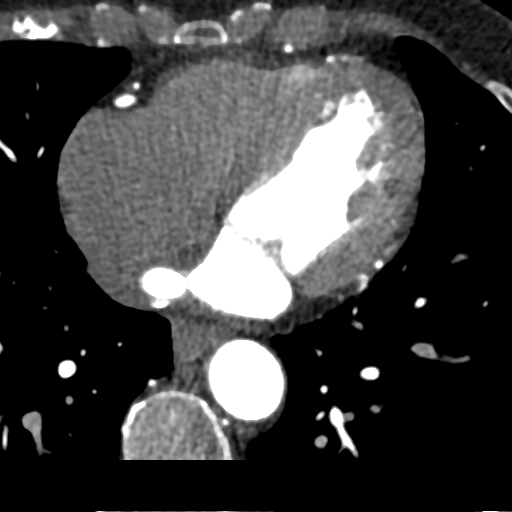
[im 224/336  vessel]
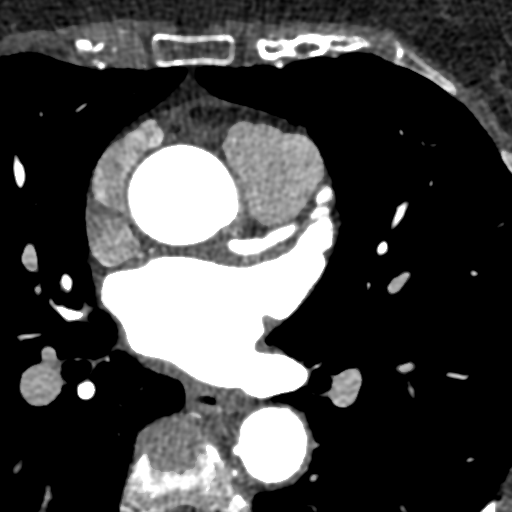

[Series 8: ts diast sharp 74 % · axial · 0.32mm/px · z∈[+1280,+1325]mm · 2 of 336 slices shown]
[im 112/336  lung]
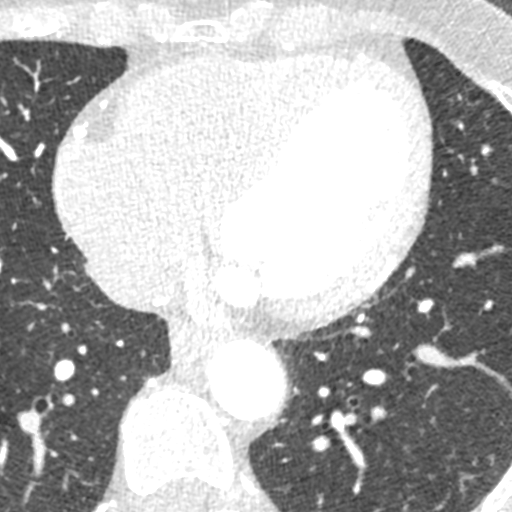
[im 224/336  lung]
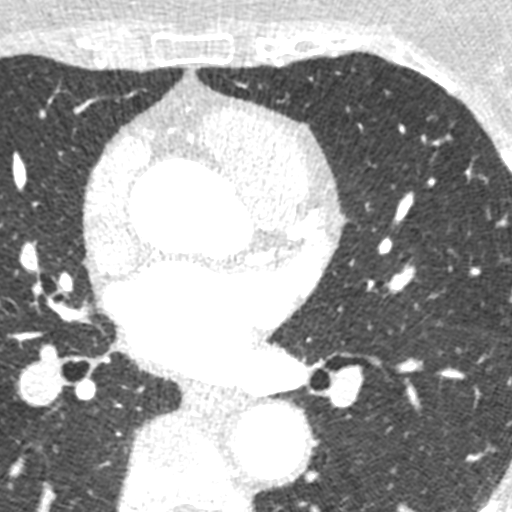

[Series 9: ts syst sharp 39 % · axial · 0.32mm/px · z∈[+1280,+1325]mm · 2 of 336 slices shown]
[im 112/336  lung]
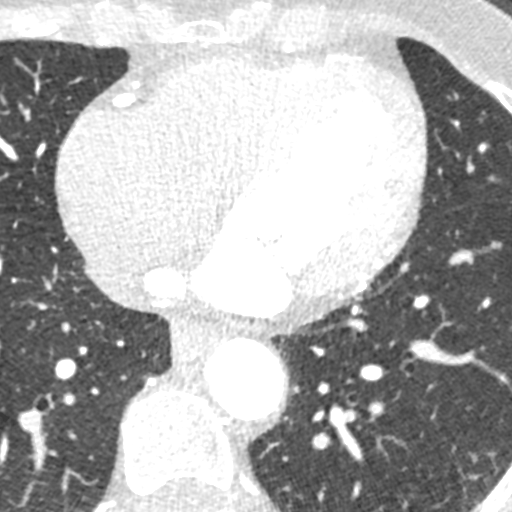
[im 224/336  lung]
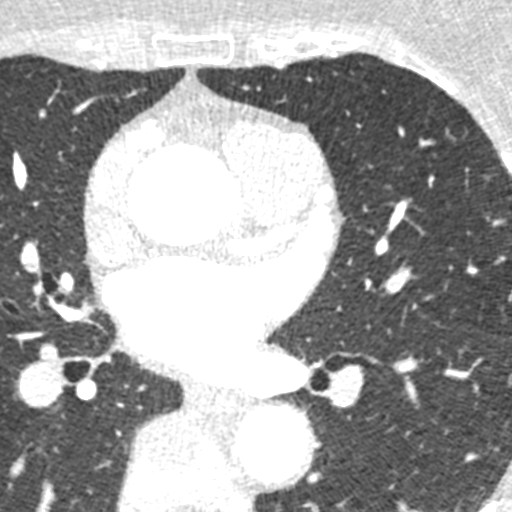

[8 of 20 positions shown; findings below may reference images not displayed]

FINDINGS: Aortic atherosclerosis. Within the visualized portions of the thorax
there are no suspicious appearing pulmonary nodules or masses, there
is no acute consolidative airspace disease, no pleural effusions, no
pneumothorax and no lymphadenopathy. Visualized portions of the
upper abdomen are unremarkable. There are no aggressive appearing
lytic or blastic lesions noted in the visualized portions of the
skeleton.
IMPRESSION: 1.  Aortic Atherosclerosis (X6A5A-XDU.U).
FINDINGS: A 120 kV prospective scan was triggered in the descending thoracic
aorta at 111 HU's. Axial non-contrast 3 mm slices were carried out
through the heart. The data set was analyzed on a dedicated work
station and scored using the Agatson method. Gantry rotation speed
was 250 msecs and collimation was .6 mm. No beta blockade and 0.8 mg
of sl NTG was given. The 3D data set was reconstructed in 5%
intervals of the 67-82 % of the R-R cycle. Diastolic phases were
analyzed on a dedicated work station using MPR, MIP and VRT modes.
The patient received 80 cc of contrast.

Aorta:  Normal size.  No calcifications.  No dissection.

Aortic Valve:  Trileaflet.  No calcifications.

Coronary Arteries:  Normal coronary origin.  Right dominance.

RCA is a large dominant artery that gives rise to PDA and PLA. There
is minimal diffuse calcified plaque with stenosis 0-25%.

Left main is a large artery that gives rise to LAD and LCX arteries.
Ostial left main has minimal calcified plaque with stenosis 0-25%.

LAD is a large vessel that gives rise to two diagonal arteries.
Proximal LAD has a long mixed plaque with maximum stenosis 50-69%.
Mid and distal LAD, D1, D2 have only minimal plaque.

LCX is a non-dominant artery that gives rise to one large OM1
branch. There is mild calcified plaque with stenosis 25-49%.

Other findings:

Normal pulmonary vein drainage into the left atrium.

Normal left atrial appendage without a thrombus.

Normal size of the pulmonary artery.
IMPRESSION: 1. Coronary calcium score of 453. This was 89 percentile for age and
sex matched control.

2. Normal coronary origin with right dominance.

3. Proximal LAD has a long mixed plaque with maximum stenosis
50-69%. CAD RADS 3. Aggressive medical management is recommended. CT
FFR will be submitted.

*** End of Addendum ***
EXAM:
OVER-READ INTERPRETATION  CT CHEST

The following report is an over-read performed by radiologist Dr.
Mxolod Ert Cecadze [REDACTED] on 03/06/2019. This
over-read does not include interpretation of cardiac or coronary
anatomy or pathology. The coronary calcium score/coronary CTA
interpretation by the cardiologist is attached.
FINDINGS: Aortic atherosclerosis. Within the visualized portions of the thorax
there are no suspicious appearing pulmonary nodules or masses, there
is no acute consolidative airspace disease, no pleural effusions, no
pneumothorax and no lymphadenopathy. Visualized portions of the
upper abdomen are unremarkable. There are no aggressive appearing
lytic or blastic lesions noted in the visualized portions of the
skeleton.
IMPRESSION: 1.  Aortic Atherosclerosis (X6A5A-XDU.U).

## 2019-03-06 MED ORDER — NITROGLYCERIN 0.4 MG SL SUBL
0.8000 mg | SUBLINGUAL_TABLET | Freq: Once | SUBLINGUAL | Status: AC
  Administered 2019-03-06: 09:00:00 0.8 mg via SUBLINGUAL

## 2019-03-06 MED ORDER — IOHEXOL 350 MG/ML SOLN
100.0000 mL | Freq: Once | INTRAVENOUS | Status: AC | PRN
  Administered 2019-03-06: 80 mL via INTRAVENOUS

## 2019-03-06 MED ORDER — NITROGLYCERIN 0.4 MG SL SUBL
SUBLINGUAL_TABLET | SUBLINGUAL | Status: AC
  Filled 2019-03-06: qty 2

## 2019-03-06 NOTE — Progress Notes (Signed)
Ct complete. Patient denies any complaints. Offered snack and beverage.

## 2019-03-12 DIAGNOSIS — B351 Tinea unguium: Secondary | ICD-10-CM | POA: Diagnosis not present

## 2019-04-16 ENCOUNTER — Other Ambulatory Visit: Payer: Self-pay

## 2019-04-16 DIAGNOSIS — Z20822 Contact with and (suspected) exposure to covid-19: Secondary | ICD-10-CM

## 2019-04-18 LAB — NOVEL CORONAVIRUS, NAA: SARS-CoV-2, NAA: NOT DETECTED

## 2019-04-20 ENCOUNTER — Ambulatory Visit: Payer: Medicare HMO | Admitting: Podiatry

## 2019-04-20 ENCOUNTER — Other Ambulatory Visit: Payer: Self-pay

## 2019-04-20 ENCOUNTER — Encounter: Payer: Self-pay | Admitting: Podiatry

## 2019-04-20 VITALS — Temp 98.0°F

## 2019-04-20 DIAGNOSIS — L600 Ingrowing nail: Secondary | ICD-10-CM | POA: Diagnosis not present

## 2019-04-20 MED ORDER — NEOMYCIN-POLYMYXIN-HC 3.5-10000-1 OT SOLN: OTIC | 1 refills | Status: AC

## 2019-04-20 NOTE — Progress Notes (Signed)
Subjective:   Patient ID: Tracie Young, female   DOB: 73 y.o.   MRN: [id number]   HPI Patient presents stating that she wants to have the big toenail removed right as it is been a chronic problem and we tried to do the corner in other areas and the nail itself continues to give her a lot of problems   ROS      Objective:  Physical Exam  Neurovascular status intact negative Homans sign was noted with a thick dystrophic big toenail right that is moderately tender when pressed from a dorsal direction     Assessment:  Ingrown toenail with chronic condition right hallux that is moderately painful when palpated     Plan:  H&P conditions reviewed and recommended nail removal.  Explained procedure risk and patient wants surgery and today I went ahead and I explained surgery allowing the patient to sign consent form.  I infiltrated the right hallux 60 mg Xylocaine Marcaine mixture remove the hallux nail exposed matrix and applied phenol for applications 30 seconds followed by alcohol lavage and sterile dressing.  Gave instructions on soaks and reappoint

## 2019-04-20 NOTE — Patient Instructions (Signed)

## 2019-04-21 ENCOUNTER — Telehealth: Payer: Self-pay | Admitting: *Deleted

## 2019-04-21 NOTE — Telephone Encounter (Signed)
Pt called and stated she got my message and understood what to do.

## 2019-04-21 NOTE — Telephone Encounter (Signed)
Left message informing pt that on occasion, a dressing will stick to the procedure site, that she had performed the soaking and dressing correctly, and the antibiotic topical would help in softening the scab and at the next soak gently lift the stuck gauze and pour the soaking water beneath and it will soften the scab.

## 2019-04-21 NOTE — Telephone Encounter (Signed)
Pt states she had an ingrown toenail removed yesterday and was unable to get all of the gauze off the site during the soaking, patted dry and covered with the ointment and a dressing.

## 2019-04-23 ENCOUNTER — Telehealth: Payer: Self-pay | Admitting: *Deleted

## 2019-04-23 NOTE — Telephone Encounter (Signed)
Left message informing pt that she could use peroxide on the area to see if the telfa/gauze square would come off, but I would be in office tomorrow from 8:00am to 2:00pm and would take a look.

## 2019-04-23 NOTE — Telephone Encounter (Signed)
Left message informing pt of my email and that if she would continue to soak and lift the edge of the gauze and pour the soaking water beneath or peroxide, but I would look at the photos and call again.

## 2019-04-23 NOTE — Telephone Encounter (Signed)
Pt states she had a toenail procedure Monday and the gauze has hardened on the toenail, she has 2 photos she can send.

## 2019-04-24 DIAGNOSIS — R69 Illness, unspecified: Secondary | ICD-10-CM | POA: Diagnosis not present

## 2019-04-24 NOTE — Telephone Encounter (Signed)
Dr. Paulla Dolly states the white tissue seen in the photos is normal and will eventually slough off.

## 2019-06-18 ENCOUNTER — Other Ambulatory Visit: Payer: Self-pay

## 2019-06-18 DIAGNOSIS — Z20822 Contact with and (suspected) exposure to covid-19: Secondary | ICD-10-CM

## 2019-06-20 LAB — NOVEL CORONAVIRUS, NAA: SARS-CoV-2, NAA: NOT DETECTED

## 2019-07-02 DIAGNOSIS — M79604 Pain in right leg: Secondary | ICD-10-CM | POA: Diagnosis not present

## 2019-07-02 DIAGNOSIS — E559 Vitamin D deficiency, unspecified: Secondary | ICD-10-CM | POA: Diagnosis not present

## 2019-07-02 DIAGNOSIS — N8110 Cystocele, unspecified: Secondary | ICD-10-CM | POA: Diagnosis not present

## 2019-07-02 DIAGNOSIS — E785 Hyperlipidemia, unspecified: Secondary | ICD-10-CM | POA: Diagnosis not present

## 2019-07-02 DIAGNOSIS — Z1389 Encounter for screening for other disorder: Secondary | ICD-10-CM | POA: Diagnosis not present

## 2019-07-02 DIAGNOSIS — Z0000 Encounter for general adult medical examination without abnormal findings: Secondary | ICD-10-CM | POA: Diagnosis not present

## 2019-07-02 DIAGNOSIS — K5900 Constipation, unspecified: Secondary | ICD-10-CM | POA: Diagnosis not present

## 2019-07-02 DIAGNOSIS — K219 Gastro-esophageal reflux disease without esophagitis: Secondary | ICD-10-CM | POA: Diagnosis not present

## 2019-07-07 DIAGNOSIS — H1045 Other chronic allergic conjunctivitis: Secondary | ICD-10-CM | POA: Diagnosis not present

## 2019-07-07 DIAGNOSIS — H905 Unspecified sensorineural hearing loss: Secondary | ICD-10-CM | POA: Diagnosis not present

## 2019-07-07 DIAGNOSIS — R69 Illness, unspecified: Secondary | ICD-10-CM | POA: Diagnosis not present

## 2019-07-15 DIAGNOSIS — H53411 Scotoma involving central area, right eye: Secondary | ICD-10-CM | POA: Diagnosis not present

## 2019-07-15 DIAGNOSIS — H524 Presbyopia: Secondary | ICD-10-CM | POA: Diagnosis not present

## 2019-07-15 DIAGNOSIS — H2513 Age-related nuclear cataract, bilateral: Secondary | ICD-10-CM | POA: Diagnosis not present

## 2019-07-15 DIAGNOSIS — H04123 Dry eye syndrome of bilateral lacrimal glands: Secondary | ICD-10-CM | POA: Diagnosis not present

## 2019-07-15 DIAGNOSIS — H5212 Myopia, left eye: Secondary | ICD-10-CM | POA: Diagnosis not present

## 2019-07-15 DIAGNOSIS — H43813 Vitreous degeneration, bilateral: Secondary | ICD-10-CM | POA: Diagnosis not present

## 2019-07-29 DIAGNOSIS — E559 Vitamin D deficiency, unspecified: Secondary | ICD-10-CM | POA: Diagnosis not present

## 2019-07-29 DIAGNOSIS — E785 Hyperlipidemia, unspecified: Secondary | ICD-10-CM | POA: Diagnosis not present

## 2019-09-18 DIAGNOSIS — H53411 Scotoma involving central area, right eye: Secondary | ICD-10-CM | POA: Diagnosis not present

## 2019-09-22 ENCOUNTER — Ambulatory Visit: Payer: Medicare HMO

## 2019-10-01 ENCOUNTER — Ambulatory Visit: Payer: Medicare HMO | Attending: Internal Medicine

## 2019-10-01 DIAGNOSIS — Z23 Encounter for immunization: Secondary | ICD-10-CM

## 2019-10-01 DIAGNOSIS — M79661 Pain in right lower leg: Secondary | ICD-10-CM | POA: Diagnosis not present

## 2019-10-01 DIAGNOSIS — N8110 Cystocele, unspecified: Secondary | ICD-10-CM | POA: Diagnosis not present

## 2019-10-01 DIAGNOSIS — R1031 Right lower quadrant pain: Secondary | ICD-10-CM | POA: Diagnosis not present

## 2019-10-01 NOTE — Progress Notes (Signed)
   Covid-19 Vaccination Clinic  Name:  Tracie Young    MRN: [id number] DOB: October 29, 1945  10/01/2019  Tracie Young was observed post Covid-19 immunization for 15 minutes without incidence. She was provided with Vaccine Information Sheet and instruction to access the V-Safe system.   Tracie Young was instructed to call 911 with any severe reactions post vaccine: Marland Kitchen Difficulty breathing  . Swelling of your face and throat  . A fast heartbeat  . A bad rash all over your body  . Dizziness and weakness    Immunizations Administered    Name Date Dose VIS Date Route   Pfizer COVID-19 Vaccine 10/01/2019  9:22 AM 0.3 mL 08/07/2019 Intramuscular   Manufacturer: [place]   Lot: [id number]   [place]: [id number]

## 2019-10-13 ENCOUNTER — Ambulatory Visit: Payer: Medicare HMO

## 2019-10-16 DIAGNOSIS — K5900 Constipation, unspecified: Secondary | ICD-10-CM | POA: Diagnosis not present

## 2019-10-16 DIAGNOSIS — N814 Uterovaginal prolapse, unspecified: Secondary | ICD-10-CM | POA: Diagnosis not present

## 2019-10-16 DIAGNOSIS — N8111 Cystocele, midline: Secondary | ICD-10-CM | POA: Diagnosis not present

## 2019-10-16 DIAGNOSIS — N816 Rectocele: Secondary | ICD-10-CM | POA: Diagnosis not present

## 2019-10-26 ENCOUNTER — Ambulatory Visit: Payer: Medicare HMO | Attending: Family Medicine

## 2019-10-26 DIAGNOSIS — Z23 Encounter for immunization: Secondary | ICD-10-CM | POA: Insufficient documentation

## 2019-10-26 NOTE — Progress Notes (Signed)
   Covid-19 Vaccination Clinic  Name:  Tracie Young    MRN: [id number] DOB: 12-31-1945  10/26/2019  [name] was observed post Covid-19 immunization for 15 minutes without incidence. She was provided with Vaccine Information Sheet and instruction to access the V-Safe system.   [name] was instructed to call 911 with any severe reactions post vaccine: Marland Kitchen Difficulty breathing  . Swelling of your face and throat  . A fast heartbeat  . A bad rash all over your body  . Dizziness and weakness    Immunizations Administered    Name Date Dose VIS Date Route   Pfizer COVID-19 Vaccine 10/26/2019  2:01 PM 0.3 mL 08/07/2019 Intramuscular   Manufacturer: [place]   Lot: [id number]   [place]: [id number]

## 2019-12-16 DIAGNOSIS — K5902 Outlet dysfunction constipation: Secondary | ICD-10-CM | POA: Diagnosis not present

## 2019-12-16 DIAGNOSIS — N812 Incomplete uterovaginal prolapse: Secondary | ICD-10-CM | POA: Diagnosis not present

## 2019-12-16 DIAGNOSIS — N819 Female genital prolapse, unspecified: Secondary | ICD-10-CM | POA: Diagnosis not present

## 2019-12-16 DIAGNOSIS — R350 Frequency of micturition: Secondary | ICD-10-CM | POA: Diagnosis not present

## 2020-01-14 DIAGNOSIS — N952 Postmenopausal atrophic vaginitis: Secondary | ICD-10-CM | POA: Diagnosis not present

## 2020-01-14 DIAGNOSIS — K5902 Outlet dysfunction constipation: Secondary | ICD-10-CM | POA: Diagnosis not present

## 2020-01-14 DIAGNOSIS — Z4689 Encounter for fitting and adjustment of other specified devices: Secondary | ICD-10-CM | POA: Diagnosis not present

## 2020-01-14 DIAGNOSIS — R350 Frequency of micturition: Secondary | ICD-10-CM | POA: Diagnosis not present

## 2020-01-14 DIAGNOSIS — N812 Incomplete uterovaginal prolapse: Secondary | ICD-10-CM | POA: Diagnosis not present

## 2020-01-29 DIAGNOSIS — K219 Gastro-esophageal reflux disease without esophagitis: Secondary | ICD-10-CM | POA: Diagnosis not present

## 2020-01-29 DIAGNOSIS — R69 Illness, unspecified: Secondary | ICD-10-CM | POA: Diagnosis not present

## 2020-01-29 DIAGNOSIS — R079 Chest pain, unspecified: Secondary | ICD-10-CM | POA: Diagnosis not present

## 2020-01-29 DIAGNOSIS — K581 Irritable bowel syndrome with constipation: Secondary | ICD-10-CM | POA: Diagnosis not present

## 2020-02-24 IMAGING — US US CAROTID DUPLEX BILAT
1 series · 13 of 24 positions shown · non-contrast
Comparison: None.

CLINICAL DATA: Right neck pain, hyperlipidemia

EXAM:
BILATERAL CAROTID DUPLEX ULTRASOUND
TECHNIQUE: Gray scale imaging, color Doppler and duplex ultrasound were
performed of bilateral carotid and vertebral arteries in the neck.

[Series 1: us carotid duplex bilat · 0.06mm/px · 13 of 48 slices shown]
[im 1/48]
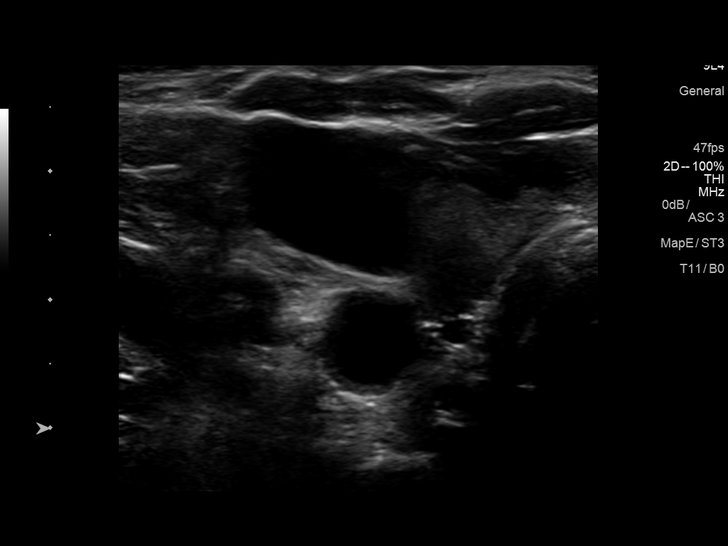
[im 5/48]
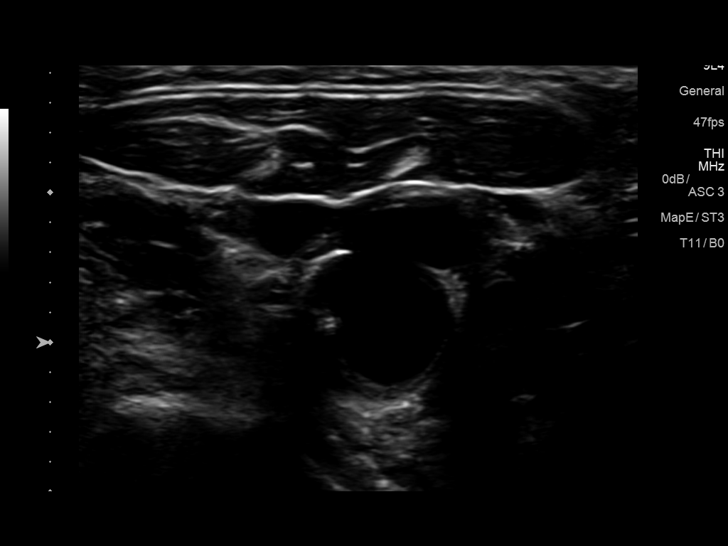
[im 9/48]
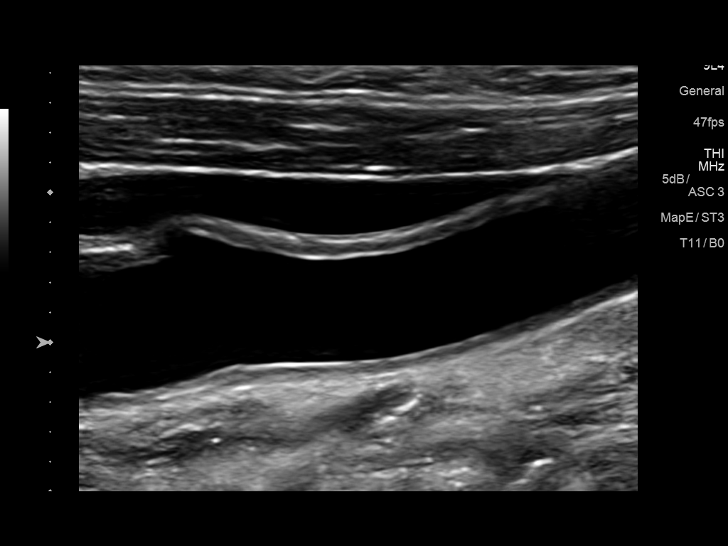
[im 13/48]
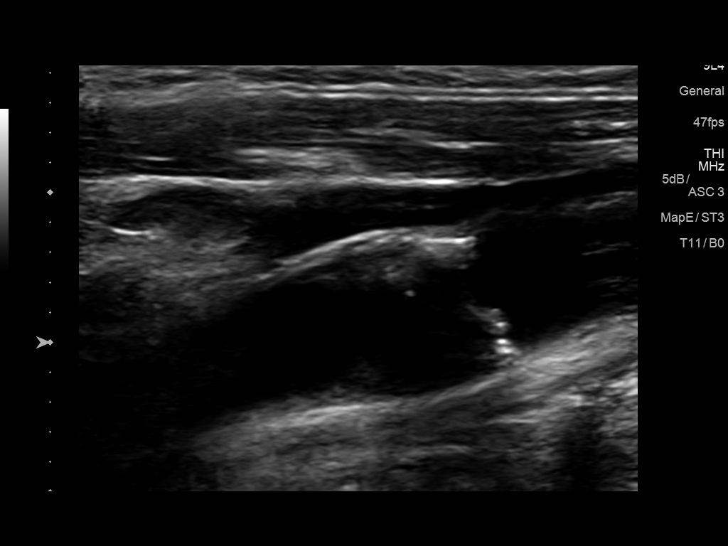
[im 17/48]
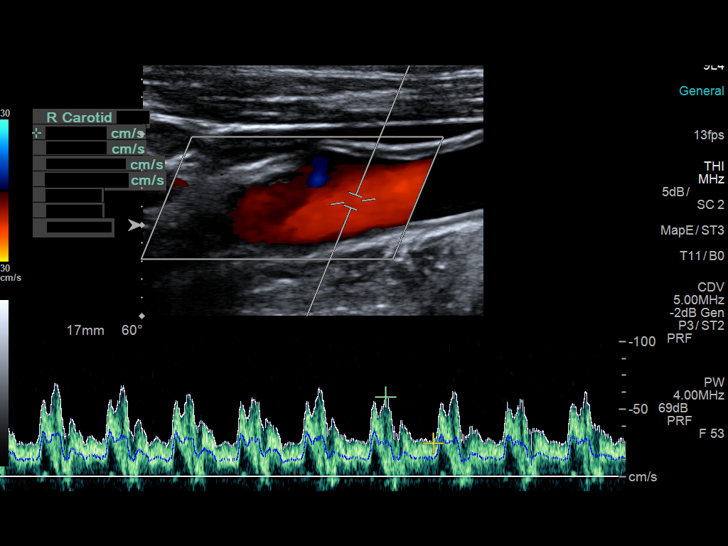
[im 21/48]
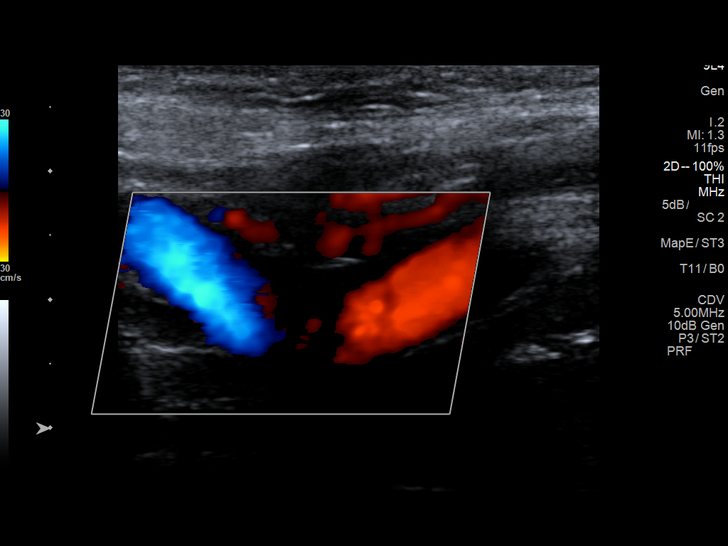
[im 25/48]
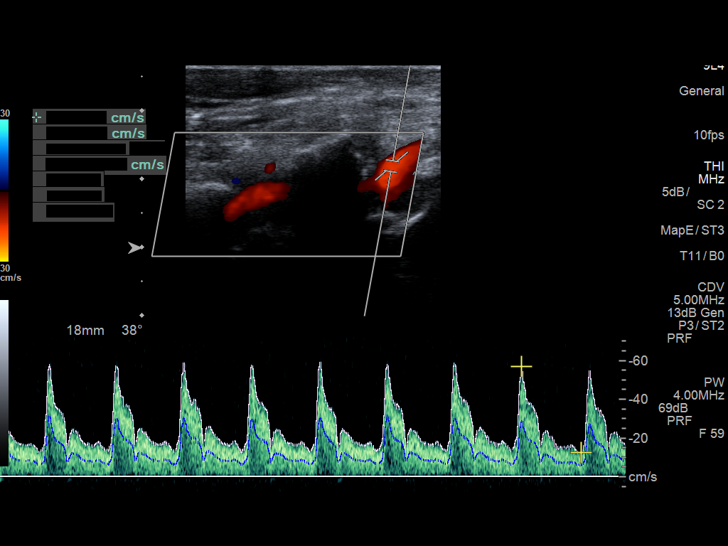
[im 27/48]
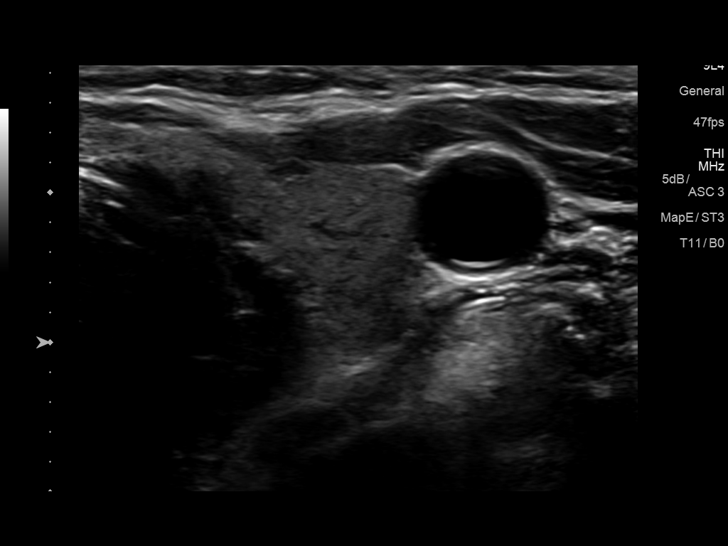
[im 31/48]
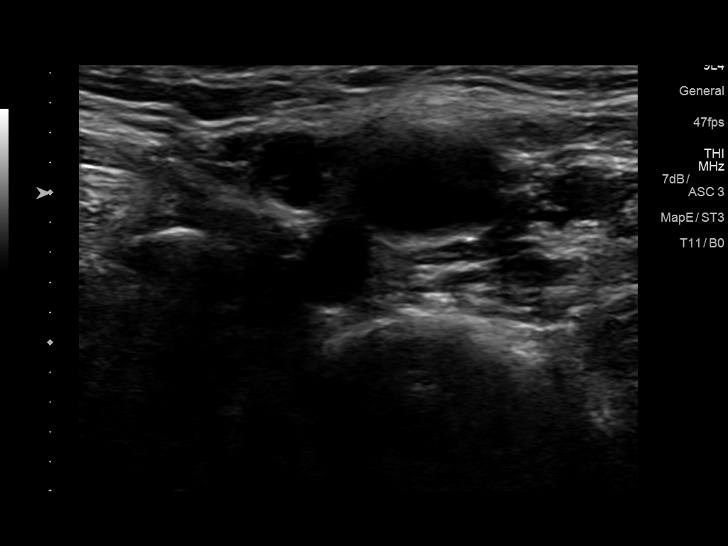
[im 35/48]
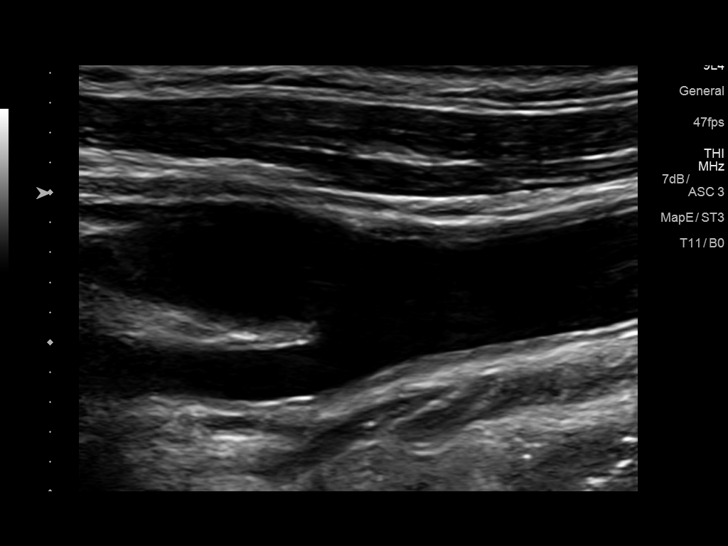
[im 39/48]
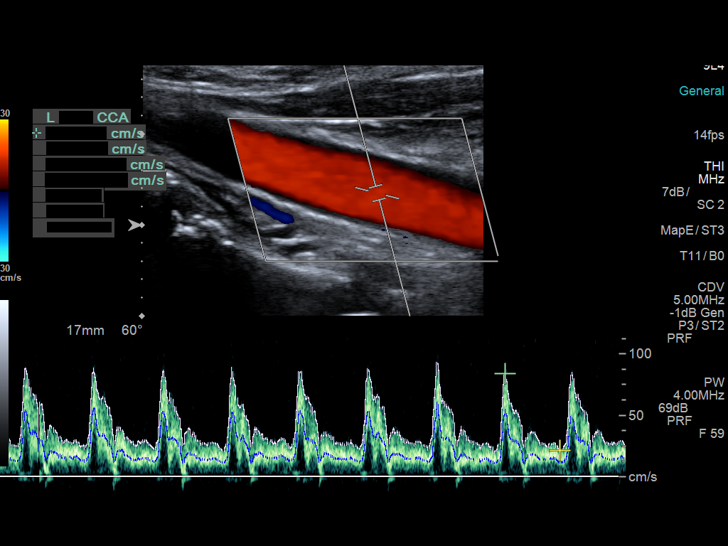
[im 43/48]
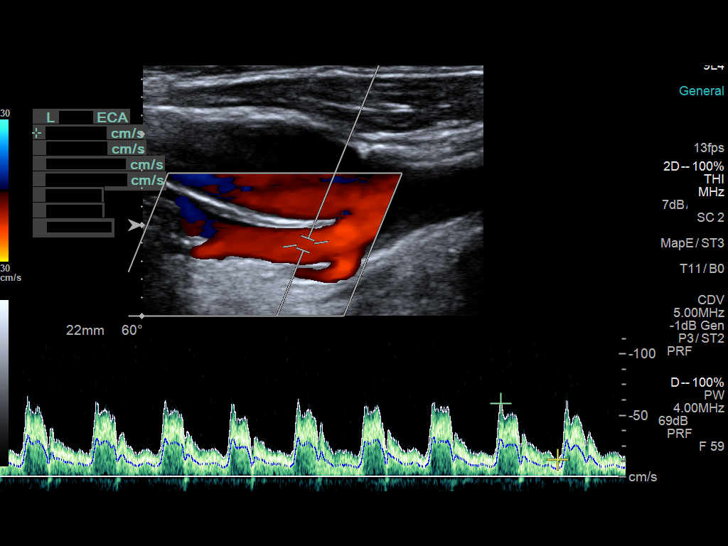
[im 48/48]
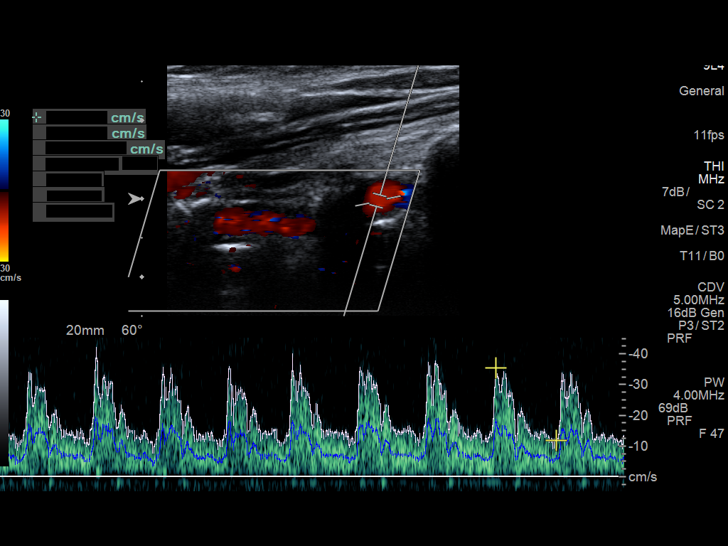

[13 of 24 positions shown; findings below may reference images not displayed]

FINDINGS: Criteria: Quantification of carotid stenosis is based on velocity
parameters that correlate the residual internal carotid diameter
with NASCET-based stenosis levels, using the diameter of the distal
internal carotid lumen as the denominator for stenosis measurement.

The following velocity measurements were obtained:

RIGHT

ICA: 97/34 cm/sec

CCA: 89/22 cm/sec

SYSTOLIC ICA/CCA RATIO:

ECA: 55 cm/sec

LEFT

ICA: 77/32 cm/sec

CCA: 98/30 cm/sec

SYSTOLIC ICA/CCA RATIO:

ECA: 60 cm/sec

RIGHT CAROTID ARTERY: Minor echogenic shadowing plaque formation. No
hemodynamically significant right ICA stenosis, velocity elevation,
or turbulent flow. Degree of narrowing less than 50%.

RIGHT VERTEBRAL ARTERY:  Antegrade

LEFT CAROTID ARTERY: Similar scattered minor echogenic plaque
formation. No hemodynamically significant left ICA stenosis,
velocity elevation, or turbulent flow.

LEFT VERTEBRAL ARTERY:  Antegrade

Upper extremity blood pressures: RIGHT: 119/67 LEFT: 115/60
IMPRESSION: Mild carotid atherosclerosis. No hemodynamically significant ICA
stenosis. Degree of narrowing less than 50% bilaterally by
ultrasound criteria.

Patent antegrade vertebral flow bilaterally

## 2020-03-10 DIAGNOSIS — M85832 Other specified disorders of bone density and structure, left forearm: Secondary | ICD-10-CM | POA: Diagnosis not present

## 2020-03-10 DIAGNOSIS — Z1231 Encounter for screening mammogram for malignant neoplasm of breast: Secondary | ICD-10-CM | POA: Diagnosis not present

## 2020-03-17 NOTE — Progress Notes (Signed)
Cardiology Office Note   Date:  03/18/2020   ID:  Tracie Young, DOB July 18, 1946, MRN [id number]  PCP:  Harlan Stains, MD    No chief complaint on file.  CAD  Wt Readings from Last 3 Encounters:  03/18/20 152 lb 3.2 oz (69 kg)  11/11/18 152 lb 6.4 oz (69.1 kg)  10/16/18 152 lb (68.9 kg)       History of Present Illness: Tracie Young is a 74 y.o. female  who has a strong family h/o CAD. She had occasional chest discomfort in 2013. She had a stress test in 11/13 showing no ischemia.   Sister with PTCA, she was a smoker. Her son had a stent in 2018  She does have an atrial septal aneurysm and hyperlipidemia.  On Oct 24, 2018, she woke up with some jaw pain.  She thought it was a sinus infection but then it got worse.  She had some associated nausea.  She had some tingling in her arms.  SHe was dizzy and had to lie down on the bathroom floor.  She could not move her arms.   Her husband found her and then they considered calling 911.  They called and then she was told to take some aspirin.  Her hand movements returned.    Husband told her she was cold and clammy.    Her ECG from EMS was ok.  She never went to the hospital.  EMS thought it may have been anxiety.  Since the last visit, she has had some chest discomfort.  It resolved with lying on her stoch and stopping coffee.   She walks for exercise. Some leg pains not related to walking.  She is walking about 4-5 days/week, for about 2 miles.   She got her COVID vaccines.    Past Medical History:  Diagnosis Date  . Atrial septal aneurysm    DR. Akansha Wyche  . Constipation   . DDD (degenerative disc disease), lumbar    DR. CABBELL, OBSERVATION  . Dyslipidemia   . Estrogen deficiency   . External thrombosed hemorrhoids   . Female cystocele    OVERACTIVE BLADDER, HAS SEEN DR. MACDIARMID  . Frequent falls   . GERD (gastroesophageal reflux disease)   . Hyperlipidemia   . IBS (irritable bowel syndrome)   .  Insomnia   . LPRD (laryngopharyngeal reflux disease)   . Osteopenia 06/2017  . Plantar fasciitis   . Sciatica, right side   . Seasonal allergic rhinitis due to pollen   . Sinusitis    UNSPECIFIED  . Vitamin D deficiency     Past Surgical History:  Procedure Laterality Date  . ANAL RECTAL MANOMETRY N/A 11/09/2015   Procedure: ANO RECTAL MANOMETRY;  Surgeon: Mauri Pole, MD;  Location: [facility];  Service: Endoscopy;  Laterality: N/A;  . CRYOSURGERY AND CONIZATION  1994  . LIPOMA REMOVED FROM BACK  2011   DR. Bellaire  . TUBAL LIGATION       Current Outpatient Medications  Medication Sig Dispense Refill  . acetaminophen (TYLENOL) 500 MG tablet Take 1,000 mg by mouth at bedtime as needed.    Marland Kitchen aspirin EC 81 MG tablet Take 1 tablet (81 mg total) by mouth daily.    Marland Kitchen atorvastatin (LIPITOR) 10 MG tablet     . cholecalciferol (VITAMIN D3) 25 MCG (1000 UT) tablet Take 2,000 Units by mouth daily.    Marland Kitchen guaiFENesin (MUCINEX) 600 MG  12 hr tablet Take 1,200 mg by mouth as needed for congestion.    . metoprolol tartrate (LOPRESSOR) 100 MG tablet TAKE 1 TABLET 2 HOURS PRIOR TO CARDIAC CT 1 tablet 0  . neomycin-polymyxin-hydrocortisone (CORTISPORIN) OTIC solution Apply 1-2 drops to toe BID after soaking 10 mL 1  . NEXIUM 40 MG capsule Take 40 mg by mouth daily at 12 noon.     . vitamin C (ASCORBIC ACID) 500 MG tablet Take 500 mg by mouth daily. TAKING DAILY DURING THE WINTER.     No current facility-administered medications for this visit.    Allergies:   Ciprofloxacin, Amitiza [lubiprostone], Codeine, Flonase [fluticasone propionate], Miralax [polyethylene glycol], Pravachol [pravastatin sodium], and Zocor [simvastatin]    Social History:  The patient  reports that she has never smoked. She has never used smokeless tobacco. She reports that she does not drink alcohol and does not use drugs.   Family History:  The patient's family history  includes CAD in her father, paternal grandfather, and sister; CVA in her maternal grandmother; Dementia in her mother; Diabetes in her paternal aunt and paternal aunt; Heart disease in her sister; Heart disease (age of onset: 61) in her father; Hypercholesterolemia in her father, mother, and sister; Hypertension in her father, maternal grandmother, mother, paternal grandfather, and sister; Lung cancer in her sister.    ROS:  Please see the history of present illness.   Otherwise, review of systems are positive for occasional anxiety.   All other systems are reviewed and negative.    PHYSICAL EXAM: VS:  BP 126/82   Pulse 66   Ht 5\' 7"  (1.702 m)   Wt 152 lb 3.2 oz (69 kg)   SpO2 94%   BMI 23.84 kg/m  , BMI Body mass index is 23.84 kg/m. GEN: Well nourished, well developed, in no acute distress  HEENT: normal  Neck: no JVD, carotid bruits, or masses Cardiac: RRR; no murmurs, rubs, or gallops,no edema  Respiratory:  clear to auscultation bilaterally, normal work of breathing GI: soft, nontender, nondistended, + BS MS: no deformity or atrophy  Skin: warm and dry, no rash Neuro:  Strength and sensation are intact Psych: euthymic mood, full affect   EKG:   The ekg ordered today demonstrates NSR, no ST changes   Recent Labs: No results found for requested labs within last 8760 hours.   Lipid Panel No results found for: CHOL, TRIG, HDL, CHOLHDL, VLDL, LDLCALC, LDLDIRECT   Other studies Reviewed: Additional studies/ records that were reviewed today with results demonstrating: LDL 104, with high HDL.   ASSESSMENT AND PLAN:  1. CAD: Chest pain/jaw pain Occurred in 2020.  Coronary CTA showed elevated calcium score with moderate LAD disease.  This was not significant by CT FFR. 2. Hyperlipidemia: Continue atorvastatin daily.  She has tried coming off atorvastatin in the past and her cholesterol goes up markedly.  High HDL.  Can consider increasing atorvastatin to 20 mg if LDL increases.   3. Family history of coronary artery disease: Sister and son with coronary artery disease.     Current medicines are reviewed at length with the patient today.  The patient concerns regarding her medicines were addressed.  The following changes have been made:  No change  Labs/ tests ordered today include:  No orders of the defined types were placed in this encounter.   Recommend 150 minutes/week of aerobic exercise Low fat, low carb, high fiber diet recommended  Disposition:   FU in 1 year  SignedLarae Grooms, MD  03/18/2020 11:36 AM    [address], [place], [place]  [address] Phone: [phone]; Fax: [phone]

## 2020-03-18 ENCOUNTER — Encounter: Payer: Self-pay | Admitting: Interventional Cardiology

## 2020-03-18 ENCOUNTER — Other Ambulatory Visit: Payer: Self-pay

## 2020-03-18 ENCOUNTER — Ambulatory Visit: Payer: Medicare HMO | Admitting: Interventional Cardiology

## 2020-03-18 VITALS — BP 126/82 | HR 66 | Ht 67.0 in | Wt 152.2 lb

## 2020-03-18 DIAGNOSIS — Z8249 Family history of ischemic heart disease and other diseases of the circulatory system: Secondary | ICD-10-CM

## 2020-03-18 DIAGNOSIS — E782 Mixed hyperlipidemia: Secondary | ICD-10-CM | POA: Diagnosis not present

## 2020-03-18 DIAGNOSIS — I25118 Atherosclerotic heart disease of native coronary artery with other forms of angina pectoris: Secondary | ICD-10-CM

## 2020-03-18 NOTE — Patient Instructions (Signed)
Medication Instructions:  Your physician recommends that you continue on your current medications as directed. Please refer to the Current Medication list given to you today.  *If you need a refill on your cardiac medications before your next appointment, please call your pharmacy*   Lab Work: None  If you have labs (blood work) drawn today and your tests are completely normal, you will receive your results only by: . MyChart Message (if you have MyChart) OR . A paper copy in the mail If you have any lab test that is abnormal or we need to change your treatment, we will call you to review the results.   Testing/Procedures: None  Follow-Up: At [facility], you and your health needs are our priority.  As part of our continuing mission to provide you with exceptional heart care, we have created designated Provider Care Teams.  These Care Teams include your primary Cardiologist (physician) and Advanced Practice Providers (APPs -  Physician Assistants and Nurse Practitioners) who all work together to provide you with the care you need, when you need it.  We recommend signing up for the patient portal called "MyChart".  Sign up information is provided on this After Visit Summary.  MyChart is used to connect with patients for Virtual Visits (Telemedicine).  Patients are able to view lab/test results, encounter notes, upcoming appointments, etc.  Non-urgent messages can be sent to your provider as well.   To learn more about what you can do with MyChart, go to [email or web address].    Your next appointment:   12 month(s)  The format for your next appointment:   In Person  Provider:   You may see Jayadeep Varanasi, MD or one of the following Advanced Practice Providers on your designated Care Team:    Dayna Dunn, PA-C  Michele Lenze, PA-C    Other Instructions  High-Fiber Diet Fiber, also called dietary fiber, is a type of carbohydrate that is found in fruits, vegetables, whole  grains, and beans. A high-fiber diet can have many health benefits. Your health care provider may recommend a high-fiber diet to help:  Prevent constipation. Fiber can make your bowel movements more regular.  Lower your cholesterol.  Relieve the following conditions: ? Swelling of veins in the anus (hemorrhoids). ? Swelling and irritation (inflammation) of specific areas of the digestive tract (uncomplicated diverticulosis). ? A problem of the large intestine (colon) that sometimes causes pain and diarrhea (irritable bowel syndrome, IBS).  Prevent overeating as part of a weight-loss plan.  Prevent heart disease, type 2 diabetes, and certain cancers. What is my plan? The recommended daily fiber intake in grams (g) includes:  38 g for men age 50 or younger.  30 g for men over age 50.  25 g for women age 50 or younger.  21 g for women over age 50. You can get the recommended daily intake of dietary fiber by:  Eating a variety of fruits, vegetables, grains, and beans.  Taking a fiber supplement, if it is not possible to get enough fiber through your diet. What do I need to know about a high-fiber diet?  It is better to get fiber through food sources rather than from fiber supplements. There is not a lot of research about how effective supplements are.  Always check the fiber content on the nutrition facts label of any prepackaged food. Look for foods that contain 5 g of fiber or more per serving.  Talk with a diet and nutrition specialist (dietitian) if you   have questions about specific foods that are recommended or not recommended for your medical condition, especially if those foods are not listed below.  Gradually increase how much fiber you consume. If you increase your intake of dietary fiber too quickly, you may have bloating, cramping, or gas.  Drink plenty of water. Water helps you to digest fiber. What are tips for following this plan?  Eat a wide variety of high-fiber  foods.  Make sure that half of the grains that you eat each day are whole grains.  Eat breads and cereals that are made with whole-grain flour instead of refined flour or white flour.  Eat brown rice, bulgur wheat, or millet instead of white rice.  Start the day with a breakfast that is high in fiber, such as a cereal that contains 5 g of fiber or more per serving.  Use beans in place of meat in soups, salads, and pasta dishes.  Eat high-fiber snacks, such as berries, raw vegetables, nuts, and popcorn.  Choose whole fruits and vegetables instead of processed forms like juice or sauce. What foods can I eat?  Fruits Berries. Pears. Apples. Oranges. Avocado. Prunes and raisins. Dried figs. Vegetables Sweet potatoes. Spinach. Kale. Artichokes. Cabbage. Broccoli. Cauliflower. Green peas. Carrots. Squash. Grains Whole-grain breads. Multigrain cereal. Oats and oatmeal. Brown rice. Barley. Bulgur wheat. Millet. Quinoa. Bran muffins. Popcorn. Rye wafer crackers. Meats and other proteins Navy, kidney, and pinto beans. Soybeans. Split peas. Lentils. Nuts and seeds. Dairy Fiber-fortified yogurt. Beverages Fiber-fortified soy milk. Fiber-fortified orange juice. Other foods Fiber bars. The items listed above may not be a complete list of recommended foods and beverages. Contact a dietitian for more options. What foods are not recommended? Fruits Fruit juice. Cooked, strained fruit. Vegetables Fried potatoes. Canned vegetables. Well-cooked vegetables. Grains White bread. Pasta made with refined flour. White rice. Meats and other proteins Fatty cuts of meat. Fried chicken or fried fish. Dairy Milk. Yogurt. Cream cheese. Sour cream. Fats and oils Butters. Beverages Soft drinks. Other foods Cakes and pastries. The items listed above may not be a complete list of foods and beverages to avoid. Contact a dietitian for more information. Summary  Fiber is a type of carbohydrate. It is  found in fruits, vegetables, whole grains, and beans.  There are many health benefits of eating a high-fiber diet, such as preventing constipation, lowering blood cholesterol, helping with weight loss, and reducing your risk of heart disease, diabetes, and certain cancers.  Gradually increase your intake of fiber. Increasing too fast can result in cramping, bloating, and gas. Drink plenty of water while you increase your fiber.  The best sources of fiber include whole fruits and vegetables, whole grains, nuts, seeds, and beans. This information is not intended to replace advice given to you by your health care provider. Make sure you discuss any questions you have with your health care provider. Document Revised: 06/17/2017 Document Reviewed: 06/17/2017 Elsevier Patient Education  2020 Elsevier Inc.   

## 2020-04-04 DIAGNOSIS — Z7189 Other specified counseling: Secondary | ICD-10-CM | POA: Diagnosis not present

## 2020-04-05 DIAGNOSIS — R928 Other abnormal and inconclusive findings on diagnostic imaging of breast: Secondary | ICD-10-CM | POA: Diagnosis not present

## 2020-04-25 DIAGNOSIS — Z20818 Contact with and (suspected) exposure to other bacterial communicable diseases: Secondary | ICD-10-CM | POA: Diagnosis not present

## 2020-04-25 DIAGNOSIS — B349 Viral infection, unspecified: Secondary | ICD-10-CM | POA: Diagnosis not present

## 2020-05-19 DIAGNOSIS — R69 Illness, unspecified: Secondary | ICD-10-CM | POA: Diagnosis not present

## 2020-05-31 DIAGNOSIS — Z4689 Encounter for fitting and adjustment of other specified devices: Secondary | ICD-10-CM | POA: Diagnosis not present

## 2020-05-31 DIAGNOSIS — N812 Incomplete uterovaginal prolapse: Secondary | ICD-10-CM | POA: Diagnosis not present

## 2020-05-31 DIAGNOSIS — N952 Postmenopausal atrophic vaginitis: Secondary | ICD-10-CM | POA: Diagnosis not present

## 2020-07-06 DIAGNOSIS — R69 Illness, unspecified: Secondary | ICD-10-CM | POA: Diagnosis not present

## 2020-07-07 DIAGNOSIS — E559 Vitamin D deficiency, unspecified: Secondary | ICD-10-CM | POA: Diagnosis not present

## 2020-07-07 DIAGNOSIS — E785 Hyperlipidemia, unspecified: Secondary | ICD-10-CM | POA: Diagnosis not present

## 2020-07-07 DIAGNOSIS — K581 Irritable bowel syndrome with constipation: Secondary | ICD-10-CM | POA: Diagnosis not present

## 2020-07-07 DIAGNOSIS — Z0000 Encounter for general adult medical examination without abnormal findings: Secondary | ICD-10-CM | POA: Diagnosis not present

## 2020-07-07 DIAGNOSIS — Z23 Encounter for immunization: Secondary | ICD-10-CM | POA: Diagnosis not present

## 2020-07-07 DIAGNOSIS — R69 Illness, unspecified: Secondary | ICD-10-CM | POA: Diagnosis not present

## 2020-07-07 DIAGNOSIS — Z1159 Encounter for screening for other viral diseases: Secondary | ICD-10-CM | POA: Diagnosis not present

## 2020-07-07 DIAGNOSIS — K219 Gastro-esophageal reflux disease without esophagitis: Secondary | ICD-10-CM | POA: Diagnosis not present

## 2020-07-07 DIAGNOSIS — M25561 Pain in right knee: Secondary | ICD-10-CM | POA: Diagnosis not present

## 2020-07-07 DIAGNOSIS — M79661 Pain in right lower leg: Secondary | ICD-10-CM | POA: Diagnosis not present

## 2020-07-07 DIAGNOSIS — Z79899 Other long term (current) drug therapy: Secondary | ICD-10-CM | POA: Diagnosis not present

## 2020-08-10 DIAGNOSIS — M7061 Trochanteric bursitis, right hip: Secondary | ICD-10-CM | POA: Diagnosis not present

## 2020-08-10 DIAGNOSIS — M79671 Pain in right foot: Secondary | ICD-10-CM | POA: Diagnosis not present

## 2020-08-10 DIAGNOSIS — M25561 Pain in right knee: Secondary | ICD-10-CM | POA: Diagnosis not present

## 2020-08-31 DIAGNOSIS — D2261 Melanocytic nevi of right upper limb, including shoulder: Secondary | ICD-10-CM | POA: Diagnosis not present

## 2020-08-31 DIAGNOSIS — D2271 Melanocytic nevi of right lower limb, including hip: Secondary | ICD-10-CM | POA: Diagnosis not present

## 2020-08-31 DIAGNOSIS — L918 Other hypertrophic disorders of the skin: Secondary | ICD-10-CM | POA: Diagnosis not present

## 2020-08-31 DIAGNOSIS — L821 Other seborrheic keratosis: Secondary | ICD-10-CM | POA: Diagnosis not present

## 2020-08-31 DIAGNOSIS — D225 Melanocytic nevi of trunk: Secondary | ICD-10-CM | POA: Diagnosis not present

## 2020-08-31 DIAGNOSIS — D2272 Melanocytic nevi of left lower limb, including hip: Secondary | ICD-10-CM | POA: Diagnosis not present

## 2020-08-31 DIAGNOSIS — L814 Other melanin hyperpigmentation: Secondary | ICD-10-CM | POA: Diagnosis not present

## 2020-08-31 DIAGNOSIS — D2372 Other benign neoplasm of skin of left lower limb, including hip: Secondary | ICD-10-CM | POA: Diagnosis not present

## 2020-08-31 DIAGNOSIS — D2262 Melanocytic nevi of left upper limb, including shoulder: Secondary | ICD-10-CM | POA: Diagnosis not present

## 2020-08-31 DIAGNOSIS — D1801 Hemangioma of skin and subcutaneous tissue: Secondary | ICD-10-CM | POA: Diagnosis not present

## 2020-09-06 DIAGNOSIS — H5213 Myopia, bilateral: Secondary | ICD-10-CM | POA: Diagnosis not present

## 2020-10-06 DIAGNOSIS — J0110 Acute frontal sinusitis, unspecified: Secondary | ICD-10-CM | POA: Diagnosis not present

## 2020-10-06 DIAGNOSIS — J069 Acute upper respiratory infection, unspecified: Secondary | ICD-10-CM | POA: Diagnosis not present

## 2020-10-18 DIAGNOSIS — H02886 Meibomian gland dysfunction of left eye, unspecified eyelid: Secondary | ICD-10-CM | POA: Diagnosis not present

## 2020-10-18 DIAGNOSIS — H02883 Meibomian gland dysfunction of right eye, unspecified eyelid: Secondary | ICD-10-CM | POA: Diagnosis not present

## 2020-10-18 DIAGNOSIS — H04123 Dry eye syndrome of bilateral lacrimal glands: Secondary | ICD-10-CM | POA: Diagnosis not present

## 2020-10-18 DIAGNOSIS — H16223 Keratoconjunctivitis sicca, not specified as Sjogren's, bilateral: Secondary | ICD-10-CM | POA: Diagnosis not present

## 2020-10-19 ENCOUNTER — Ambulatory Visit: Payer: Medicare HMO | Admitting: Podiatry

## 2020-10-24 ENCOUNTER — Other Ambulatory Visit: Payer: Self-pay

## 2020-10-24 ENCOUNTER — Encounter: Payer: Self-pay | Admitting: Podiatry

## 2020-10-24 ENCOUNTER — Ambulatory Visit: Payer: Medicare HMO | Admitting: Podiatry

## 2020-10-24 DIAGNOSIS — L600 Ingrowing nail: Secondary | ICD-10-CM | POA: Diagnosis not present

## 2020-10-24 NOTE — Patient Instructions (Signed)

## 2020-10-24 NOTE — Progress Notes (Signed)
Subjective:   Patient ID: Tracie Young, female   DOB: 75 y.o.   MRN: [id number]   HPI Patient presents stating that his right big toenail has started to grow back on one side and it is irritated.  Not as thick as it used to be but gets tender   ROS      Objective:  Physical Exam  Neurovascular status intact with patient found to have a incurvated right hallux nail that is sore and it is coming mostly from the lateral side.  Patient has good digital perfusion      Assessment:  Chronic nail disease right hallux with pain     Plan:  H&P reviewed condition recommended removal of the nail explained procedure risk and patient wants surgery.  Today I infiltrated the right hallux 60 mg like Marcaine mixture remove the hallux nail that was left exposed matrix applied phenol for applications 30 seconds followed by alcohol lavage sterile dressing.  Gave instructions on soaks reappoint

## 2020-10-31 DIAGNOSIS — L814 Other melanin hyperpigmentation: Secondary | ICD-10-CM | POA: Diagnosis not present

## 2020-10-31 DIAGNOSIS — L820 Inflamed seborrheic keratosis: Secondary | ICD-10-CM | POA: Diagnosis not present

## 2020-10-31 DIAGNOSIS — D2239 Melanocytic nevi of other parts of face: Secondary | ICD-10-CM | POA: Diagnosis not present

## 2020-12-19 DIAGNOSIS — H16223 Keratoconjunctivitis sicca, not specified as Sjogren's, bilateral: Secondary | ICD-10-CM | POA: Diagnosis not present

## 2021-01-12 DIAGNOSIS — R21 Rash and other nonspecific skin eruption: Secondary | ICD-10-CM | POA: Diagnosis not present

## 2021-01-16 DIAGNOSIS — L400 Psoriasis vulgaris: Secondary | ICD-10-CM | POA: Diagnosis not present

## 2021-01-25 DIAGNOSIS — R21 Rash and other nonspecific skin eruption: Secondary | ICD-10-CM | POA: Diagnosis not present

## 2021-01-25 DIAGNOSIS — M25561 Pain in right knee: Secondary | ICD-10-CM | POA: Diagnosis not present

## 2021-01-25 DIAGNOSIS — M7061 Trochanteric bursitis, right hip: Secondary | ICD-10-CM | POA: Diagnosis not present

## 2021-01-25 DIAGNOSIS — R69 Illness, unspecified: Secondary | ICD-10-CM | POA: Diagnosis not present

## 2021-01-31 DIAGNOSIS — M25551 Pain in right hip: Secondary | ICD-10-CM | POA: Diagnosis not present

## 2021-01-31 DIAGNOSIS — M2391 Unspecified internal derangement of right knee: Secondary | ICD-10-CM | POA: Diagnosis not present

## 2021-01-31 DIAGNOSIS — M7061 Trochanteric bursitis, right hip: Secondary | ICD-10-CM | POA: Diagnosis not present

## 2021-01-31 DIAGNOSIS — M1711 Unilateral primary osteoarthritis, right knee: Secondary | ICD-10-CM | POA: Diagnosis not present

## 2021-02-22 DIAGNOSIS — R21 Rash and other nonspecific skin eruption: Secondary | ICD-10-CM | POA: Diagnosis not present

## 2021-03-30 DIAGNOSIS — Z1231 Encounter for screening mammogram for malignant neoplasm of breast: Secondary | ICD-10-CM | POA: Diagnosis not present

## 2021-04-17 DIAGNOSIS — J029 Acute pharyngitis, unspecified: Secondary | ICD-10-CM | POA: Diagnosis not present

## 2021-04-17 DIAGNOSIS — R6883 Chills (without fever): Secondary | ICD-10-CM | POA: Diagnosis not present

## 2021-04-17 DIAGNOSIS — U071 COVID-19: Secondary | ICD-10-CM | POA: Diagnosis not present

## 2021-04-17 DIAGNOSIS — R52 Pain, unspecified: Secondary | ICD-10-CM | POA: Diagnosis not present

## 2021-04-17 DIAGNOSIS — R059 Cough, unspecified: Secondary | ICD-10-CM | POA: Diagnosis not present

## 2021-05-02 DIAGNOSIS — R21 Rash and other nonspecific skin eruption: Secondary | ICD-10-CM | POA: Diagnosis not present

## 2021-06-05 DIAGNOSIS — R69 Illness, unspecified: Secondary | ICD-10-CM | POA: Diagnosis not present

## 2021-06-05 DIAGNOSIS — Z8639 Personal history of other endocrine, nutritional and metabolic disease: Secondary | ICD-10-CM | POA: Diagnosis not present

## 2021-06-05 DIAGNOSIS — L659 Nonscarring hair loss, unspecified: Secondary | ICD-10-CM | POA: Diagnosis not present

## 2021-06-05 DIAGNOSIS — Z8616 Personal history of COVID-19: Secondary | ICD-10-CM | POA: Diagnosis not present

## 2021-07-24 DIAGNOSIS — E785 Hyperlipidemia, unspecified: Secondary | ICD-10-CM | POA: Diagnosis not present

## 2021-07-24 DIAGNOSIS — Z0000 Encounter for general adult medical examination without abnormal findings: Secondary | ICD-10-CM | POA: Diagnosis not present

## 2021-07-24 DIAGNOSIS — N8111 Cystocele, midline: Secondary | ICD-10-CM | POA: Diagnosis not present

## 2021-07-24 DIAGNOSIS — L503 Dermatographic urticaria: Secondary | ICD-10-CM | POA: Diagnosis not present

## 2021-07-24 DIAGNOSIS — N814 Uterovaginal prolapse, unspecified: Secondary | ICD-10-CM | POA: Diagnosis not present

## 2021-07-24 DIAGNOSIS — Z79899 Other long term (current) drug therapy: Secondary | ICD-10-CM | POA: Diagnosis not present

## 2021-07-24 DIAGNOSIS — R69 Illness, unspecified: Secondary | ICD-10-CM | POA: Diagnosis not present

## 2021-07-24 DIAGNOSIS — N816 Rectocele: Secondary | ICD-10-CM | POA: Diagnosis not present

## 2021-07-24 DIAGNOSIS — K219 Gastro-esophageal reflux disease without esophagitis: Secondary | ICD-10-CM | POA: Diagnosis not present

## 2021-08-22 NOTE — Progress Notes (Signed)
Cardiology Office Note   Date:  08/23/2021   ID:  Tracie Young, Tracie Young [birth year], MRN [id number]  PCP:  Harlan Stains, MD    No chief complaint on file.  CAD  Wt Readings from Last 3 Encounters:  08/23/21 147 lb 3.2 oz (66.8 kg)  03/18/20 152 lb 3.2 oz (69 kg)  11/11/18 152 lb 6.4 oz (69.1 kg)       History of Present Illness: [name] is a 75 y.o. female   who has a strong family h/o CAD. She had occasional chest discomfort in 2013. She had a stress test in 11/13 showing no ischemia.    Sister with PTCA, she was a smoker. Her son had a stent in 2018   She does have an atrial septal aneurysm and hyperlipidemia.   On Oct 24, 2018, she woke up with some jaw pain.  She thought it was a sinus infection but then it got worse.  She had some associated nausea.  She had some tingling in her arms.  SHe was dizzy and had to lie down on the bathroom floor.  She could not move her arms.    Her husband found her and then they considered calling 911.  They called and then she was told to take some aspirin.  Her hand movements returned.     Husband told her she was cold and clammy.     Her ECG from EMS was ok.  She never went to the hospital.  EMS thought it may have been anxiety.  Chest pain/jaw pain Occurred in 2020.  Coronary CTA showed elevated calcium score with moderate LAD disease.  This was not significant by CT FFR.  Denies : exertional Chest pain. Dizziness. Leg edema. Nitroglycerin use. Orthopnea. Palpitations. Paroxysmal nocturnal dyspnea. Shortness of breath. Syncope.    Has some right shoulder pain that is sharp, usually at rest. Not related to walking.   Past Medical History:  Diagnosis Date   Atrial septal aneurysm    DR. Bastion Bolger   Constipation    DDD (degenerative disc disease), lumbar    DR. CABBELL, OBSERVATION   Dyslipidemia    Estrogen deficiency    External thrombosed hemorrhoids    Female cystocele    OVERACTIVE BLADDER, HAS SEEN DR. MACDIARMID    Frequent falls    GERD (gastroesophageal reflux disease)    Hyperlipidemia    IBS (irritable bowel syndrome)    Insomnia    LPRD (laryngopharyngeal reflux disease)    Osteopenia 06/2017   Plantar fasciitis    Sciatica, right side    Seasonal allergic rhinitis due to pollen    Sinusitis    UNSPECIFIED   Vitamin D deficiency     Past Surgical History:  Procedure Laterality Date   ANAL RECTAL MANOMETRY N/A 11/09/2015   Procedure: ANO RECTAL MANOMETRY;  Surgeon: Mauri Pole, MD;  Location: [facility];  Service: Endoscopy;  Laterality: N/A;   CRYOSURGERY AND CONIZATION  1994   LIPOMA REMOVED FROM BACK  2011   DR. Georgette Dover   TONSILLECTOMY AND ADENOIDECTOMY  1958   TUBAL LIGATION       Current Outpatient Medications  Medication Sig Dispense Refill   acetaminophen (TYLENOL) 500 MG tablet Take 1,000 mg by mouth at bedtime as needed.     aspirin EC 81 MG tablet Take 1 tablet (81 mg total) by mouth daily.     atorvastatin (LIPITOR) 10 MG tablet      Biotin 5000 MCG  TABS Take 1 tablet by mouth daily.     cetirizine (ZYRTEC) 10 MG tablet Take 1 tablet by mouth daily.     cholecalciferol (VITAMIN D3) 25 MCG (1000 UT) tablet Take 2,000 Units by mouth daily.     Cyanocobalamin (VITAMIN B-12) 1000 MCG SUBL See admin instructions.     guaiFENesin (MUCINEX) 600 MG 12 hr tablet Take 1,200 mg by mouth as needed for congestion.     NEXIUM 40 MG capsule Take 40 mg by mouth daily at 12 noon.      traZODone (DESYREL) 50 MG tablet Take 25-100 mg by mouth at bedtime as needed.     vitamin C (ASCORBIC ACID) 500 MG tablet Take 500 mg by mouth daily. TAKING DAILY DURING THE WINTER.     metoprolol tartrate (LOPRESSOR) 100 MG tablet TAKE 1 TABLET 2 HOURS PRIOR TO CARDIAC CT (Patient not taking: Reported on 08/23/2021) 1 tablet 0   neomycin-polymyxin-hydrocortisone (CORTISPORIN) OTIC solution Apply 1-2 drops to toe BID after soaking (Patient not taking: Reported on 08/23/2021) 10 mL 1   RESTASIS  0.05 % ophthalmic emulsion 1 drop 2 (two) times daily. (Patient not taking: Reported on 89/38/1017)     Salicylic Acid (DERMAREST PSORIASIS) 3 % SHAM 1 application as needed (Patient not taking: Reported on 08/23/2021)     No current facility-administered medications for this visit.    Allergies:   Ciprofloxacin, Amitiza [lubiprostone], Codeine, Flonase [fluticasone propionate], Linaclotide, Miralax [polyethylene glycol], Pravachol [pravastatin sodium], Terbinafine, and Zocor [simvastatin]    Social History:  The patient  reports that she has never smoked. She has never used smokeless tobacco. She reports that she does not drink alcohol and does not use drugs.   Family History:  The patient's family history includes CAD in her father, paternal grandfather, and sister; CVA in her maternal grandmother; Dementia in her mother; Diabetes in her paternal aunt and paternal aunt; Heart disease in her sister; Heart disease (age of onset: 60) in her father; Hypercholesterolemia in her father, mother, and sister; Hypertension in her father, maternal grandmother, mother, paternal grandfather, and sister; Lung cancer in her sister.    ROS:  Please see the history of present illness.   Otherwise, review of systems are positive for shoulder pain.   All other systems are reviewed and negative.    PHYSICAL EXAM: VS:  BP 122/76 (BP Location: Left Arm, Patient Position: Sitting, Cuff Size: Normal)    Pulse 69    Ht 5\' 7"  (1.702 m)    Wt 147 lb 3.2 oz (66.8 kg)    SpO2 98%    BMI 23.05 kg/m  , BMI Body mass index is 23.05 kg/m. GEN: Well nourished, well developed, in no acute distress HEENT: normal Neck: no JVD, carotid bruits, or masses Cardiac: RRR; no murmurs, rubs, or gallops,no edema  Respiratory:  clear to auscultation bilaterally, normal work of breathing GI: soft, nontender, nondistended, + BS MS: no deformity or atrophy Skin: warm and dry, no rash Neuro:  Strength and sensation are intact Psych:  euthymic mood, full affect   EKG:   The ekg ordered today demonstrates NSR, no Q waves, no ST changes   Recent Labs: No results found for requested labs within last 8760 hours.   Lipid Panel No results found for: CHOL, TRIG, HDL, CHOLHDL, VLDL, LDLCALC, LDLDIRECT   Other studies Reviewed: Additional studies/ records that were reviewed today with results demonstrating: labs reviewed.   ASSESSMENT AND PLAN:  CAD: Moderate LAD disease by prior  CTA in 2020.  Has not had any angina.   Hyperlipidemia: LDL 127, (increased from 104), HDL 71 in 06/2021.  On low dose atorvastatin.  Given moderate CAD, would switch to rosuvastatin 20 mg daily.  Check liver and lipid tests in about 3 months.  Whole food, plant-based diet with lots of fiber. Family history of coronary artery disease: Sister with CAD.     Current medicines are reviewed at length with the patient today.  The patient concerns regarding her medicines were addressed.  The following changes have been made:    Labs/ tests ordered today include:  No orders of the defined types were placed in this encounter.   Recommend 150 minutes/week of aerobic exercise Low fat, low carb, high fiber diet recommended  Disposition:   FU in 1 year   Signed, Larae Grooms, MD  08/23/2021 3:24 PM    [facility] [place], [place],   [address] Phone: [phone]; Fax: [phone]

## 2021-08-23 ENCOUNTER — Other Ambulatory Visit: Payer: Self-pay

## 2021-08-23 ENCOUNTER — Ambulatory Visit: Payer: Medicare HMO | Admitting: Interventional Cardiology

## 2021-08-23 ENCOUNTER — Encounter: Payer: Self-pay | Admitting: Interventional Cardiology

## 2021-08-23 VITALS — BP 122/76 | HR 69 | Ht 67.0 in | Wt 147.2 lb

## 2021-08-23 DIAGNOSIS — I25118 Atherosclerotic heart disease of native coronary artery with other forms of angina pectoris: Secondary | ICD-10-CM

## 2021-08-23 DIAGNOSIS — Z8249 Family history of ischemic heart disease and other diseases of the circulatory system: Secondary | ICD-10-CM | POA: Diagnosis not present

## 2021-08-23 DIAGNOSIS — E782 Mixed hyperlipidemia: Secondary | ICD-10-CM

## 2021-08-23 MED ORDER — ROSUVASTATIN CALCIUM 20 MG PO TABS: 20.0000 mg | ORAL_TABLET | Freq: Every day | ORAL | 3 refills | Status: AC

## 2021-08-23 NOTE — Patient Instructions (Signed)
Medication Instructions:  1) DISCONTINUE Atorvastatin 2) START Rosuvastatin 20mg  once daily  *If you need a refill on your cardiac medications before your next appointment, please call your pharmacy*   Lab Work: Liver and Lipid in 3 months.  You will need to be fasting for these labs (nothing to eat or drink after midnight except water and black coffee).  If you have labs (blood work) drawn today and your tests are completely normal, you will receive your results only by: [place] (if you have MyChart) OR A paper copy in the mail If you have any lab test that is abnormal or we need to change your treatment, we will call you to review the results.   Testing/Procedures: None   Follow-Up: At [facility], you and your health needs are our priority.  As part of our continuing mission to provide you with exceptional heart care, we have created designated Provider Care Teams.  These Care Teams include your primary Cardiologist (physician) and Advanced Practice Providers (APPs -  Physician Assistants and Nurse Practitioners) who all work together to provide you with the care you need, when you need it.  We recommend signing up for the patient portal called "MyChart".  Sign up information is provided on this After Visit Summary.  MyChart is used to connect with patients for Virtual Visits (Telemedicine).  Patients are able to view lab/test results, encounter notes, upcoming appointments, etc.  Non-urgent messages can be sent to your provider as well.   To learn more about what you can do with MyChart, go to [email or web address].    Your next appointment:   1 year(s)  The format for your next appointment:   In Person  Provider:   Larae Grooms, MD     Other Instructions

## 2021-09-04 DIAGNOSIS — D1801 Hemangioma of skin and subcutaneous tissue: Secondary | ICD-10-CM | POA: Diagnosis not present

## 2021-09-04 DIAGNOSIS — L820 Inflamed seborrheic keratosis: Secondary | ICD-10-CM | POA: Diagnosis not present

## 2021-09-04 DIAGNOSIS — D2271 Melanocytic nevi of right lower limb, including hip: Secondary | ICD-10-CM | POA: Diagnosis not present

## 2021-09-04 DIAGNOSIS — L814 Other melanin hyperpigmentation: Secondary | ICD-10-CM | POA: Diagnosis not present

## 2021-09-04 DIAGNOSIS — L400 Psoriasis vulgaris: Secondary | ICD-10-CM | POA: Diagnosis not present

## 2021-09-04 DIAGNOSIS — D2272 Melanocytic nevi of left lower limb, including hip: Secondary | ICD-10-CM | POA: Diagnosis not present

## 2021-09-04 DIAGNOSIS — L720 Epidermal cyst: Secondary | ICD-10-CM | POA: Diagnosis not present

## 2021-09-04 DIAGNOSIS — L84 Corns and callosities: Secondary | ICD-10-CM | POA: Diagnosis not present

## 2021-09-04 DIAGNOSIS — D225 Melanocytic nevi of trunk: Secondary | ICD-10-CM | POA: Diagnosis not present

## 2021-09-04 DIAGNOSIS — L821 Other seborrheic keratosis: Secondary | ICD-10-CM | POA: Diagnosis not present

## 2021-09-07 DIAGNOSIS — H5213 Myopia, bilateral: Secondary | ICD-10-CM | POA: Diagnosis not present

## 2021-10-24 DIAGNOSIS — L03213 Periorbital cellulitis: Secondary | ICD-10-CM | POA: Diagnosis not present

## 2021-11-13 DIAGNOSIS — L03213 Periorbital cellulitis: Secondary | ICD-10-CM | POA: Diagnosis not present

## 2021-11-13 DIAGNOSIS — H00024 Hordeolum internum left upper eyelid: Secondary | ICD-10-CM | POA: Diagnosis not present

## 2021-11-16 ENCOUNTER — Telehealth: Payer: Self-pay | Admitting: Interventional Cardiology

## 2021-11-16 NOTE — Telephone Encounter (Signed)
Patient states she is moving to [place], [place] on 3/28 and would like to know if Dr. Irish Lack can refer her to a new cardiologist in that area.  ?She states [place] town, but the cardiologist may be found in [place] or [place], [place]. ?

## 2021-11-17 ENCOUNTER — Other Ambulatory Visit: Payer: Medicare HMO | Admitting: *Deleted

## 2021-11-17 ENCOUNTER — Other Ambulatory Visit: Payer: Self-pay

## 2021-11-17 DIAGNOSIS — E782 Mixed hyperlipidemia: Secondary | ICD-10-CM | POA: Diagnosis not present

## 2021-11-17 DIAGNOSIS — I25118 Atherosclerotic heart disease of native coronary artery with other forms of angina pectoris: Secondary | ICD-10-CM

## 2021-11-17 LAB — HEPATIC FUNCTION PANEL
ALT: 14 IU/L (ref 0–32)
AST: 16 IU/L (ref 0–40)
Albumin: 4.3 g/dL (ref 3.7–4.7)
Alkaline Phosphatase: 70 IU/L (ref 44–121)
Bilirubin Total: 0.4 mg/dL (ref 0.0–1.2)
Bilirubin, Direct: 0.12 mg/dL (ref 0.00–0.40)
Total Protein: 6.4 g/dL (ref 6.0–8.5)

## 2021-11-17 LAB — LIPID PANEL
Chol/HDL Ratio: 2.3 ratio (ref 0.0–4.4)
Cholesterol, Total: 136 mg/dL (ref 100–199)
HDL: 60 mg/dL (ref >39)
LDL Chol Calc (NIH): 62 mg/dL (ref 0–99)
Triglycerides: 66 mg/dL (ref 0–149)
VLDL Cholesterol Cal: 14 mg/dL (ref 5–40)

## 2021-11-17 NOTE — Telephone Encounter (Signed)
I don't know any cardiologists in [place].  Maybe Dr. Angelena Form knows someone there? ?

## 2021-11-17 NOTE — Telephone Encounter (Signed)
Per DPR it is OK to leave message.  Message left for patient with information from Dr Irish Lack.  ?

## 2021-11-21 ENCOUNTER — Other Ambulatory Visit: Payer: Medicare HMO

## 2024-02-10 NOTE — Progress Notes (Signed)
 Blood drawn by [facility], CMA Blood only
